# Patient Record
Sex: Female | Born: 1965 | Race: White | Hispanic: No | Marital: Married | Smoking: Never smoker
Health system: Southern US, Community
[De-identification: ages and names within clinical notes are randomized; demographics above are authoritative.]

## PROBLEM LIST (undated history)

## (undated) DIAGNOSIS — T7840XA Allergy, unspecified, initial encounter: Secondary | ICD-10-CM

## (undated) DIAGNOSIS — K5792 Diverticulitis of intestine, part unspecified, without perforation or abscess without bleeding: Secondary | ICD-10-CM

## (undated) DIAGNOSIS — Z5189 Encounter for other specified aftercare: Secondary | ICD-10-CM

## (undated) DIAGNOSIS — D649 Anemia, unspecified: Secondary | ICD-10-CM

## (undated) HISTORY — PX: HERNIA REPAIR: SHX51

## (undated) HISTORY — DX: Allergy, unspecified, initial encounter: T78.40XA

## (undated) HISTORY — DX: Anemia, unspecified: D64.9

## (undated) HISTORY — PX: APPENDECTOMY: SHX54

## (undated) HISTORY — DX: Encounter for other specified aftercare: Z51.89

---

## 1999-04-28 ENCOUNTER — Other Ambulatory Visit: Admission: RE | Admit: 1999-04-28 | Discharge: 1999-04-28 | Payer: Self-pay | Admitting: Obstetrics and Gynecology

## 2000-01-12 ENCOUNTER — Other Ambulatory Visit: Admission: RE | Admit: 2000-01-12 | Discharge: 2000-01-12 | Payer: Self-pay | Admitting: Obstetrics and Gynecology

## 2000-07-05 ENCOUNTER — Inpatient Hospital Stay (HOSPITAL_COMMUNITY): Admission: AD | Admit: 2000-07-05 | Discharge: 2000-07-05 | Payer: Self-pay | Admitting: Obstetrics and Gynecology

## 2000-07-07 ENCOUNTER — Inpatient Hospital Stay (HOSPITAL_COMMUNITY): Admission: AD | Admit: 2000-07-07 | Discharge: 2000-07-07 | Payer: Self-pay | Admitting: Obstetrics and Gynecology

## 2000-08-05 ENCOUNTER — Encounter (INDEPENDENT_AMBULATORY_CARE_PROVIDER_SITE_OTHER): Payer: Self-pay | Admitting: Specialist

## 2000-08-05 ENCOUNTER — Inpatient Hospital Stay (HOSPITAL_COMMUNITY): Admission: AD | Admit: 2000-08-05 | Discharge: 2000-08-08 | Payer: Self-pay | Admitting: Obstetrics & Gynecology

## 2000-09-16 ENCOUNTER — Other Ambulatory Visit: Admission: RE | Admit: 2000-09-16 | Discharge: 2000-09-16 | Payer: Self-pay | Admitting: Obstetrics and Gynecology

## 2001-01-02 ENCOUNTER — Encounter (INDEPENDENT_AMBULATORY_CARE_PROVIDER_SITE_OTHER): Payer: Self-pay | Admitting: Specialist

## 2001-01-02 ENCOUNTER — Other Ambulatory Visit: Admission: RE | Admit: 2001-01-02 | Discharge: 2001-01-02 | Payer: Self-pay | Admitting: *Deleted

## 2001-08-20 ENCOUNTER — Other Ambulatory Visit: Admission: RE | Admit: 2001-08-20 | Discharge: 2001-08-20 | Payer: Self-pay | Admitting: Obstetrics and Gynecology

## 2001-12-06 ENCOUNTER — Encounter: Payer: Self-pay | Admitting: Obstetrics & Gynecology

## 2001-12-06 ENCOUNTER — Inpatient Hospital Stay (HOSPITAL_COMMUNITY): Admission: AD | Admit: 2001-12-06 | Discharge: 2001-12-06 | Payer: Self-pay | Admitting: Obstetrics & Gynecology

## 2001-12-26 ENCOUNTER — Inpatient Hospital Stay (HOSPITAL_COMMUNITY): Admission: AD | Admit: 2001-12-26 | Discharge: 2001-12-26 | Payer: Self-pay | Admitting: Obstetrics & Gynecology

## 2001-12-27 ENCOUNTER — Inpatient Hospital Stay (HOSPITAL_COMMUNITY): Admission: AD | Admit: 2001-12-27 | Discharge: 2001-12-27 | Payer: Self-pay | Admitting: Obstetrics and Gynecology

## 2002-01-05 ENCOUNTER — Observation Stay (HOSPITAL_COMMUNITY): Admission: AD | Admit: 2002-01-05 | Discharge: 2002-01-06 | Payer: Self-pay | Admitting: Obstetrics and Gynecology

## 2002-01-31 ENCOUNTER — Encounter: Payer: Self-pay | Admitting: Obstetrics & Gynecology

## 2002-01-31 ENCOUNTER — Inpatient Hospital Stay (HOSPITAL_COMMUNITY): Admission: AD | Admit: 2002-01-31 | Discharge: 2002-01-31 | Payer: Self-pay | Admitting: Obstetrics & Gynecology

## 2002-02-02 ENCOUNTER — Inpatient Hospital Stay (HOSPITAL_COMMUNITY): Admission: AD | Admit: 2002-02-02 | Discharge: 2002-02-23 | Payer: Self-pay | Admitting: Obstetrics and Gynecology

## 2002-02-02 ENCOUNTER — Encounter (INDEPENDENT_AMBULATORY_CARE_PROVIDER_SITE_OTHER): Payer: Self-pay | Admitting: Specialist

## 2002-02-02 ENCOUNTER — Encounter: Payer: Self-pay | Admitting: Obstetrics and Gynecology

## 2002-02-06 ENCOUNTER — Encounter: Payer: Self-pay | Admitting: Obstetrics and Gynecology

## 2002-02-10 ENCOUNTER — Encounter: Payer: Self-pay | Admitting: Obstetrics and Gynecology

## 2002-02-13 ENCOUNTER — Encounter: Payer: Self-pay | Admitting: Obstetrics and Gynecology

## 2002-02-17 ENCOUNTER — Encounter: Payer: Self-pay | Admitting: Obstetrics and Gynecology

## 2002-02-20 ENCOUNTER — Encounter: Payer: Self-pay | Admitting: Obstetrics and Gynecology

## 2002-03-24 ENCOUNTER — Other Ambulatory Visit: Admission: RE | Admit: 2002-03-24 | Discharge: 2002-03-24 | Payer: Self-pay | Admitting: Obstetrics and Gynecology

## 2002-04-17 ENCOUNTER — Ambulatory Visit (HOSPITAL_COMMUNITY): Admission: RE | Admit: 2002-04-17 | Discharge: 2002-04-17 | Payer: Self-pay | Admitting: Obstetrics and Gynecology

## 2002-11-26 HISTORY — PX: TUBAL LIGATION: SHX77

## 2003-05-05 ENCOUNTER — Other Ambulatory Visit: Admission: RE | Admit: 2003-05-05 | Discharge: 2003-05-05 | Payer: Self-pay | Admitting: Obstetrics and Gynecology

## 2003-11-27 HISTORY — PX: ENDOMETRIAL ABLATION: SHX621

## 2003-12-12 ENCOUNTER — Emergency Department (HOSPITAL_COMMUNITY): Admission: AD | Admit: 2003-12-12 | Discharge: 2003-12-12 | Payer: Self-pay | Admitting: Internal Medicine

## 2004-02-08 ENCOUNTER — Ambulatory Visit (HOSPITAL_COMMUNITY): Admission: RE | Admit: 2004-02-08 | Discharge: 2004-02-08 | Payer: Self-pay | Admitting: *Deleted

## 2004-02-08 ENCOUNTER — Encounter (INDEPENDENT_AMBULATORY_CARE_PROVIDER_SITE_OTHER): Payer: Self-pay | Admitting: Specialist

## 2004-05-03 ENCOUNTER — Other Ambulatory Visit: Admission: RE | Admit: 2004-05-03 | Discharge: 2004-05-03 | Payer: Self-pay | Admitting: Obstetrics and Gynecology

## 2008-03-18 ENCOUNTER — Encounter: Admission: RE | Admit: 2008-03-18 | Discharge: 2008-03-18 | Payer: Self-pay | Admitting: Obstetrics and Gynecology

## 2009-04-21 ENCOUNTER — Encounter: Admission: RE | Admit: 2009-04-21 | Discharge: 2009-04-21 | Payer: Self-pay | Admitting: Obstetrics and Gynecology

## 2010-04-25 ENCOUNTER — Encounter: Admission: RE | Admit: 2010-04-25 | Discharge: 2010-04-25 | Payer: Self-pay | Admitting: Obstetrics and Gynecology

## 2010-04-28 ENCOUNTER — Encounter: Admission: RE | Admit: 2010-04-28 | Discharge: 2010-04-28 | Payer: Self-pay | Admitting: Obstetrics and Gynecology

## 2010-12-17 ENCOUNTER — Encounter: Payer: Self-pay | Admitting: Family Medicine

## 2011-04-13 NOTE — Op Note (Signed)
NAME:  Karen Watts, Karen Watts                       ACCOUNT NO.:  [id number]   MEDICAL RECORD NO.:  [id number]                   PATIENT TYPE:  AMB   LOCATION:  SDC                                  FACILITY:  [facility]   PHYSICIAN:  Lenoard Aden, M.D.             DATE OF BIRTH:  [birth year]   DATE OF PROCEDURE:  02/08/2004  DATE OF DISCHARGE:                                 OPERATIVE REPORT   PREOPERATIVE DIAGNOSIS:  Menometrorrhagia refractory to medical therapy and  normal workup.   POSTOPERATIVE DIAGNOSIS:  Menometrorrhagia refractory to medical therapy and  normal workup.   PROCEDURE:  NovaSure endometrial thermal ablation.   SURGEON:  Lenoard Aden, M.D.   ANESTHESIA:  General.   ESTIMATED BLOOD LOSS:  Less than 50 cc.   COMPLICATIONS:  None.   DRAINS:  None.   COUNTS:  Correct.   Patient recovering in good condition.   BRIEF OPERATIVE NOTE:  After being apprised of the previous risks of  inability to cure bleeding, possible uterine perforation and need for  repair, the patient was brought to the operating room, after where she has a  PPH performed by Dr. Luan Pulling and dictated separately at this time.  Patient is placed in the dorsal lithotomy position, still up in the  stirrups, exam under anesthesia reveals a small retroflexed uterus.  No  adnexal masses.  At this time, the anterior cervical area is grasped using a  single-tooth tenaculum and dilated up to a #27 with a Pratt dilator.  Cervical length of 3.5 cm noted.  Uterus completely sounds to 7 cm.  At this  time, the NovaSure unit is placed in a standard fashion, and ablation is  performed at a power of 77 for one minute and 50 seconds.  No complications  noted.  The procedure is terminated in the standard fashion.  Instruments  are removed.  Examination under anesthesia reveals a normal-sized uterus.  No adnexal masses.  No bleeding is noted.  Patient tolerated her procedure  well and was transferred to  recovery in good condition.                                               Lenoard Aden, M.D.    RJT/MEDQ  D:  02/08/2004  T:  02/08/2004  Job:  [id number]

## 2011-04-13 NOTE — Op Note (Signed)
NAME:  Karen Watts, Karen Watts                       ACCOUNT NO.:  [id number]   MEDICAL RECORD NO.:  [id number]                   PATIENT TYPE:  AMB   LOCATION:  DAY                                  FACILITY:  [facility]   PHYSICIAN:  Vikki Ports, M.D.         DATE OF BIRTH:  Sep 16, 1966   DATE OF PROCEDURE:  02/08/2004  DATE OF DISCHARGE:                                 OPERATIVE REPORT   PREOPERATIVE DIAGNOSIS:  Internal hemorrhoids.   POSTOPERATIVE DIAGNOSIS:  Internal hemorrhoids.   PROCEDURE:  Procedure for prolapsing hemorrhoids.   SURGEON:  Danna Hefty, M.D.   ANESTHESIA:  General.   DESCRIPTION OF PROCEDURE:  The patient was taken to the operating room,  placed in a supine position.  After adequate general anesthesia was induced,  she was placed in the prone jack-knife position.  Perianal and rectal prep  were undertaken.  Hemorrhoidal bundles were injected with 0.5 Marcaine and  Wydase.  Anal dilatation was performed up to 3 fingers.  Internal and  external sphincter muscles were injected with 30 mL of 0.5 Marcaine.  A 2-0  Prolene suture was placed in a pursestring fashion 5 cm proximal to the  dentate line.  PPH stapler was then introduced in through the anvil, and  pursestring suture was tied down around it.  Stapler was closed, held in  place for 30 seconds, fired, held in place for an additional 20 seconds and  removed.  There was a healthy 1 cm rim of hemorrhoidal tissue.  No evidence  of muscle.  Vagina was checked prior to the firing of the stapler.  The  staple line was inspected, had adequate hemostasis.  Gelfoam packing was  placed.  The patient tolerated the procedure well and was placed in the  supine position for endometrial ablation by Dr. Billy Coast.                                               Vikki Ports, M.D.    KRH/MEDQ  D:  02/08/2004  T:  02/08/2004  Job:  [id number]   cc:   Lenoard Aden, M.D.  [address]  [place]  [place]  [address]  Fax: [phone]

## 2011-04-13 NOTE — Discharge Summary (Signed)
[facility] of [facility]  Patient:    Karen Watts, Karen Watts Visit Number: [id number] MRN: [id number]          Service Type: OBS Attending Physician:  Maxie Better Dictated by:   Mont Dutton, M.D. Admit Date:  02/02/2002 Discharge Date: 02/23/2002                             Discharge Summary  ADMISSION DIAGNOSES:          1. Active labor with precipitous delivery.                               2. Retained placenta.  DISCHARGE DIAGNOSES:          1. Status post precipitous vaginal delivery of                                  viable female.                               2. Retained placenta status post manual                                  extraction in operating room without                                  complications.                               3. Bilateral tubal ligation.  HISTORY OF PRESENT ILLNESS:   The patient is a 45 year old, G4, P3-0-0-3, who presented at 41 weeks with contractions and spontaneous rupture of membranes. The patient was sleeping.  She was noted to have late prenatal care.  Onset of care was at [facility] at 38 weeks.  She had been on no medicatins.  She had normal spontaneous vaginal delivery x 3 previously.  No significant gynecologic, medical, or surgical history.  SOCIAL HISTORY:               She did smoke one-half pack per day, denied alcohol or other drug use.  HOSPITAL COURSE:              On cervical exam, she is 10 cm 100% effaced, and + 2 station.  The patient had spontaneous rupture of membranes complete and had precipitous delivery in the MAU.  Retained placenta was noted.  The patient went to OR for manual removal.  The patient delivered in MAU a viable female infant with Apgars 9/1 and 9/5. For her retained placenta, the patient underwent manual extraction in the OR without complications.  The patient also underwent a bilateral tubal ligation without difficulty on August 06, 2001.  The  patient was bottle feeding on urine drug screen secondary to patients lack of prenatal care.  She was noted to be positive for cannibis.  Newborn nursery was notified.  The patient did ahe a partial accreta noted.  On the day after admission, hospital day #2, the patient was doing well with slight abdominal pain, minimal lochia.  The baby was doing well  with bottle feeds, no complaints.  The patient was voiding well.   On exam, she had a firm fundus and umbilicus.  Routine postpartum care.  The babys urine drug screen was negative.  Social work was involved in patients hospitalization, noted that mom did have two previous postive urine drug screens during pregnancy. ______ report was made.  ______ notified for followup and CDC referral made.  The patient was stable for discharge on August 07, 2001.  DISPOSITION:                  She was discharged to home in wheelchair in stable condition.  DISCHARGE MEDICATIONS:        1. Motrin 600 mg 1 tablet q.6-8h. p.r.n.                               2. Prenatal vitamins 1 tablet p.o. q.d. until                                  postpartum checkup.  DISCHARGE INSTRUCTIONS:       Booklet was given for discharge teaching.  The patient is to bring her child to pediatric office August 11, 2001, at 1:30, to [facility].  DIET:                         Regular.  ACTIVITY:                     Per standard booklet.  Sexual activity per booklet.  FOLLOWUP:                     [facility] in six weeks. Dictated by:   Mont Dutton, M.D. Attending Physician:  Maxie Better DD:  03/20/02 TD:  03/21/02 Job: [id number]

## 2011-04-13 NOTE — H&P (Signed)
[facility] of [facility]  Patient:    Karen Watts, Karen Watts                      MRN: [id number] Adm. Date:  [id number] Attending:  Genia Del Dictator:   Genia Del, M.D.                         History and Physical  IDENTIFICATION:               The patient is a 45 year old G1, last menstrual period November 15, 1999, for an expected date of delivery August 23, 2000, at 37 weeks 4 days gestation.  REASON FOR ADMISSION:         Amniotic fluid leak at 4 p.m. on August 05, 2000.  HISTORY OF PRESENT ILLNESS:   Good fetal activity, no vaginal bleeding.  The patient felt clear fluid leak since 4 p.m. on September 10.  She had irregular mild uterine contractions.  No PIH symptoms.  PAST MEDICAL HISTORY:         IBS.  PAST SURGICAL HISTORY:        Appendicectomy.  MEDICATIONS:                  Prenatal vitamins.  ALLERGIES:                    No known drug allergies.  HISTORY OF PRESENT PREGNANCY:                    First trimester was normal.  Labs in first trimester showed hemoglobin 13.7, platelets 243.  Blood type Rh A positive, Rh antibody is negative.  RPR nonreactive.  HBsAg negative.  HIV nonreactive. Rubella immune.  Pap test within normal limits.  Gonorrhea and Chlamydia negative.  In the second trimester she had a triple test which was within normal limits.  A 18+ weeks she had a fall and an ultrasound was done showing a small subchorionic hematoma which decreased in size on followup ultrasound. At 21+ weeks gestation ultrasound revealed anatomy was within normal limits except for a low-lying placenta at 1.2 cm from the os.  Amniotic fluid was normal.  Cervix was 3.4 cm long.  At 28+ weeks placental insertion was shown to be normal with at least 4.9 cm distance from the internal os.  The cervix was 3.2 cm long and the estimated fetal weight was 99 percentile.  A one-hour GTT was abnormal, a three-hour GTT was done and it came back  normal.  She was then followed for preterm labor which was controlled by bed rest.  Group B strep at 35+ weeks was negative.  REVIEW OF SYSTEMS:            Constitutional: Negative.  HEENT: Negative. Respiratory: Negative.  Cardiovascular: Negative.  GI: Negative.  Urologic: Negative.  Dermatologic: Negative.  Neurologic: Negative.  PHYSICAL EXAMINATION:  GENERAL:                      On arrival the patient is in no apparent distress.  VITAL SIGNS:                  Blood pressure is 119/74, pulse 78, respiratory rate 20, temperature 97.2.  LUNGS:                        Clear bilaterally.  HEART:  Regular cardiac rhythm, no murmur.  ABDOMEN:                      Gravid with a uterine height at 37 cm, vertex presentation.  Fetal heart rate 130-140 per minute and no deceleration, good accelerations.  Uterine contractions are mild and irregular.  PELVIC:                       A speculum exam by Dr. Billy Coast at office: Crist Fat positive, Nitrazine positive, amniotic fluid clear.  Vaginal exam showed a 3+ dilated cervix, 80% effaced, vertex -1.  EXTREMITIES:                  All limbs were normal with mild edema and no clonus.  IMPRESSION:                   A gravida 1 at 37+ weeks gestation with the spontaneous rupture of membranes, not in active labor currently.  Fetal well-being reassuring.  Group B Streptococcus negative.  PLAN:                         Admit to labor and delivery, continuous monitoring.  Pitocin induction decided with the patient after an attempt at waiting and ambulating to see if labor would start spontaneously.  Will start ampicillin IV for prolonged rupture of membranes if not about to deliver at 4 a.m..  Expectant management for probable vaginal delivery. DD:  08/06/00 TD:  08/06/00 Job: [id number]

## 2011-04-13 NOTE — H&P (Signed)
[facility] of [facility]  Patient:    Karen Watts, FLEET Visit Number: [id number] MRN: [id number]          Service Type: OBS Attending Physician:  Maxie Better Dictated by:   Lenoard Aden, M.D. Admit Date:  02/02/2002 Discharge Date: 02/23/2002                           History and Physical  HISTORY OF PRESENT ILLNESS:   A 45 year old white female, G2, P2, who presents with desire for elective sterilization.  PAST OBSTETRIC HISTORY:       Remarkable for two vaginal deliveries, recent vaginal delivery complicated by placental abruption and early placenta previa. Her blood type is A positive.  ALLERGIES:                    She has no known drug allergies.  MEDICATIONS:                  Previously prenatal vitamins.  PAST MEDICAL HISTORY:         She has a history of irritable bowel syndrome.  PAST SURGICAL HISTORY:        Tonsillectomy in childhood and appendectomy in childhood.  SOCIAL HISTORY:               She is a nonsmoker, nondrinker.  Denies domestic or physical violence.  FAMILY HISTORY:               Noncontributory.  PHYSICAL EXAMINATION:  GENERAL:                      She is a well-developed, well-nourished white female in no apparent distress.  HEENT:                        Normal.  LUNGS:                        Clear.  HEART:                        Regular rate and rhythm.  ABDOMEN:                      Soft, nongravid, nontender.  PELVIC:                       Uterus is normal size, shape and contour.  No adnexal masses are appreciated.  IMPRESSION:                   Desire for elective sterilization.  PLAN:                         Proceed with laparoscopic tubal sterilization. The risks of anesthesia, infection, bleeding, injury to abdominal organs and the need for repair is discussed, delayed versus immediate complications to include bowel and bladder injury noted, failure risk of tubal ligation at 5-10 per 1000  noted.  The patient acknowledges and desires to proceed. Dictated by:   Lenoard Aden, M.D. Attending Physician:  Maxie Better DD:  04/17/02 TD:  04/17/02 Job: [id number]

## 2011-04-13 NOTE — Discharge Summary (Signed)
[facility] of [facility]  Patient:    Karen Watts, Karen Watts Visit Number: [id number] MRN: [id number]          Service Type: OBS Location: 910B 9152 01 Attending Physician:  Lenoard Aden Dictated by:   Lenoard Aden, M.D. Admit Date:  01/05/2002 Discharge Date: 01/06/2002   CC:         [facility]   Discharge Summary  REASON FOR ADMISSION:         Preterm labor.  DISCHARGE DIAGNOSIS:          Preterm labor.  HISTORY OF PRESENT ILLNESS:   The patient underwent hospitalization on January 05, 2002 for cervical change.  External ______ monitoring reveals irregular contractions on p.o. terbutaline and ibuprofen over a 24-hour period.  No cervical change noted.  She is discharged to home on day #1 on p.o. tocolytics to include terbutaline and ibuprofen.  She is to follow up in the office in one week.  Preterm labor warnings given.  Ambien prescription given. Dictated by:   Lenoard Aden, M.D. Attending Physician:  Lenoard Aden DD:  01/06/02 TD:  01/06/02 Job: [id number]

## 2011-04-13 NOTE — Discharge Summary (Signed)
[facility] of [facility]  Patient:    Karen Watts, Karen Watts Visit Number: [id number] MRN: [id number]          Service Type: OBS Attending Physician:  Maxie Better Dictated by:   Marina Gravel, M.D. Admit Date:  02/02/2002 Discharge Date: 02/23/2002                             Discharge Summary  ADMISSION DIAGNOSES:          1. Intrauterine pregnancy at 30-6/7 weeks.                               2. Third trimester vaginal bleeding.                               3. Chronic abruptio placenta.  DISCHARGE DIAGNOSES:          1. Intrauterine pregnancy at 30-6/7 weeks.                               2. Third trimester vaginal bleeding.                               3. Chronic abruptio placenta.  PROCEDURES:                   1. Admission for bed rest and continuous                                  monitoring.                               2. Amniocentesis for fetal immaturity.                               3. Induction of labor due to presence of fetal                                  immaturity with history of chronic abruption.  HISTORY OF PRESENT ILLNESS:   For complete details, please see the History & Physical on the chart.  Briefly, the patient presented as a 45 year old white female, gravida 2, para 1 at 30-6/7 weeks with third trimester vaginal bleeding.  She was hospitalized after her second episode of bleeding.  She has a history of preterm cervical change with known positive fetal fibronectin. She had been on bed rest and use of p.r.n. oral terbutaline.  The patient received betamethasone on January 31 and December 27, 2001.   Had been stable with cervical exam of 2 cm on bed rest at home.  During admission evaluation, she was found to be contracting every 2 to 4 minutes with vaginal bleeding.  The patient was admitted for further management of the above issues.  HOSPITAL COURSE:              On admission, the patient was tocolyzed with magnesium  sulfate, monitored continuously, and kept on bed rest.  Contractions subsequently abated, and the patient  did not demonstrate further cervical change.  Ultrasound showed no evidence of acute abruption and no evidence of placenta previa.  Magnesium sulfate was subsequently discontinued, and the patient was converted to terbutaline pump and was continued to be monitored and kept at bed rest in the hospital.  Additional monitoring was performed with twice biophysical profiles and umbilical artery Dopplers which showed reassuring results.  The patient remained stable until 34 weeks and was subsequently tested for fetal immaturity by amniocentesis.  The results showed LS ration 3.0 to 1.0 with PG present.  The patient was, therefore, induced given these results.  The patient was inducted with artificial rupture of membranes and Pitocin. She was found to have port wine stained amniotic fluid at amniotomy.  The patient was given ampicillin for unknown group B strep status.  She subsequently progressed and delivered by spontaneous vaginal delivery of a viable female infant, Apgars 8/9.  The NICU team was present at delivery. Nuchal cord x 1 was noted.  Placenta was spontaneously delivered and had changes that appeared consistent with old infarcts.  Arterial cord pH was obtained and was 7.01.  The patient postpartum had a rapid return to ability to ambulate, void, and tolerate a regular diet.  She was found to have a mild anemia with hemoglobin 7.4.   Therefore, I recommended against elective tubal sterilization at this time.  She was discharged home on the second postpartum day in satisfactory condition.  She was noted to be A positive, rubella immune, with hemoglobin as outlined above.  DISCHARGE MEDICATIONS:        1. Ferrous sulfate 325 mg p.o. daily.                               2. Colace 1 p.o. daily.  [facility] to six [facility]  CONDITION UPON  DISCHARGE:     Satisfactory. Dictated by:   Marina Gravel, M.D. Attending Physician:  Maxie Better DD:  03/23/02 TD:  03/23/02 Job: [id number]

## 2011-04-13 NOTE — H&P (Signed)
[facility] of [facility]  Patient:    Karen Watts, Karen Watts Visit Number: [id number] MRN: [id number]          Service Type: OBS Location: 910B 9152 01 Attending Physician:  Marcelle Overlie Dictated by:   Lenoard Aden, M.D. Admit Date:  01/05/2002                           History and Physical  CHIEF COMPLAINT:  Preterm labor.  HISTORY OF PRESENT ILLNESS:  A 45 year old Caucasian female G2, P69, EDD Apr 02, 2002, at 34-4/7 weeks who presents with progressive cervical change.  ALLERGIES:  PROCARDIA, questionable intolerance.  MEDICATIONS:  Prenatal vitamins and terbutaline.  PAST OBSTETRIC HISTORY: 1. Uncomplicated 8 lb 9 ounce female born at term in September 2001. 2. History of abnormal Pap smear. 3. History of premature birth. 4. History of irritable bowel syndrome. 5. History of appendectomy. 6. History of tonsillectomy.  FAMILY HISTORY:  Heart disease, stroke, and autism.  PRENATAL LABORATORY DATA:  Blood type A+, Rh antibody negative, rubella immune, HBsAg negative, HIV declined.  GBS negative on December 29, 2001. GC and chlamydia negative.  Fetal fibronectin positive.  The patient has a history of unexplained second trimester bleeding.  Most recent ultrasound on December 15, 2001, reveals  posterior low lying placenta with an estimated fetal weight of 1 lb 12 ounces in the 73rd percentile and a normal AFI.  PHYSICAL EXAMINATION:  GENERAL:  She is a well-developed well-nourished white female.  Blood pressure 110/78, weight 160.6.  HEENT:  Normal.  LUNGS:  Clear.  HEART:  Tachycardic with no murmurs, rubs or gallops.  ABDOMEN:  Soft, gravid, nontender.  Fundal height of 31.  No CVA tenderness.  EXTREMITIES:  REveals no cords.  NEUROLOGIC: Nonfocal.  CERVIX:  2 cm dilated, 2 to 2-1/2 cm long, soft, vertex and minus 2.  IMPRESSION:  A 27-4/7 week intrauterine pregnancy.  Progressive preterm cervicaL change on p.o. tocolytics.  PLAN:   Admission.  Possible IV tocolysis.  Continuous monitoring.  Bed rest with bathroom privileges only. Dictated by:   Lenoard Aden, M.D. Attending Physician:  Marcelle Overlie DD:  01/05/02 TD:  01/05/02 Job: [id number]

## 2011-04-13 NOTE — Op Note (Signed)
[facility]  Patient:    Karen Watts, Karen Watts Visit Number: [id number] MRN: [id number]          Service Type: DSU Location: [facility] Attending Physician:  Lenoard Aden Dictated by:   Lenoard Aden, M.D. Proc. Date: 04/17/02 Admit Date:  04/17/2002 Discharge Date: 04/17/2002   CC:         [facility]   Operative Report  PREOPERATIVE DIAGNOSIS:       Desire for elective sterilization.  POSTOPERATIVE DIAGNOSIS:      Desire for elective sterilization.  OPERATION:                    Laparoscopic tubal sterilization.  SURGEON:                      Lenoard Aden, M.D.  ANESTHESIA:                   General.  ESTIMATED BLOOD LOSS:         50 cc.  COMPLICATIONS:                None.  DRAINS:                       None.  COUNTS:                       Correct.  DISPOSITION:                  The patient to recovery in good condition.  DESCRIPTION OF PROCEDURE:     After being apprised of the risks of anesthesia, infection, bleeding, injury to abdominal organs and need for repair, the patient was brought to the operating room.  She was administered a general anesthetic without complications, prepped and draped in the usual sterile fashion.  Catheterized until the bladder was empty.  After achieving adequate anesthesia, exam under anesthesia was a small anteflexed uterus, and no adnexal masses.  A Hulka tenaculum was placed per vagina.  Attention was then turned to the abdominal portion of the procedure, whereby an infraumbilical incision was made with the scalpel after dilute Marcaine solution, 5 cc was placed.  A Veress needle placed.  Opening pressure -1 noted.  Five liters of CO2 insufflated without difficulty.  Setting patient pressure is 25.  Atraumatic placement of 10 mm trocar is done atraumatic. Trocar visualization is visualized.  A picture is taken.  Patient pressure reset to 15.  Liver and gallbladder bed appeared within  normal limits.  The appendix is surgically removed.  Small adhesions of the bowel mesentery to the right lateral abdominal wall are noted.  Normal tubes, ovaries, and uterus are observed.  The right tube is traced out to the fimbriated end and grasped using Klepinger bipolar cautery and cauterized to a resistance of zero in three contiguous portions of the ampullary isthmic section of the tube. At this time, the same procedure was done to the left tube.  Both tubes are divided using straight scissors and tubal lumens are visualized.  Normal ovaries are noted.  Normal anterior and posterior cul-de-sac.  CO2 is released and the instruments removed under direct visualization.  The incision was closed using 0 Vicryl in an interrupted fashion.  Dermabond placed on the skin.  Good hemostasis noted.  Instruments were removed from the vagina. The patient was awakened and transferred to recovery in good condition. Dictated  by:   Lenoard Aden, M.D. Attending Physician:  Lenoard Aden DD:  04/17/02 TD:  04/21/02 Job: [id number]

## 2011-04-13 NOTE — H&P (Signed)
NAME:  Karen Watts, Karen Watts                       ACCOUNT NO.:  [id number]   MEDICAL RECORD NO.:  [id number]                   PATIENT TYPE:  AMB   LOCATION:  [facility]                                  FACILITY:  [facility]   PHYSICIAN:  Lenoard Aden, M.D.             DATE OF BIRTH:  1966/10/04   DATE OF ADMISSION:  02/08/2004  DATE OF DISCHARGE:                                HISTORY & PHYSICAL   CHIEF COMPLAINT:  Worsening menometrorrhagia with normal work-up.   HISTORY OF PRESENT ILLNESS:  The patient is a 45 year old white female, G2,  P2, who presents for definitive management of irregular and heavy uterine  bleeding.   PAST MEDICAL HISTORY:  1. Remarkable for laparoscopic tubal ligation and two vaginal deliveries.  2. She has no known drug allergies.  3. Medications:  None.  4. She has a history of irritable bowel syndrome.  5. __________ colectomy.  6. Appendectomy.   SOCIAL HISTORY:  Nonsmoker, nondrinker.  Denies domestic or physical  violence.   FAMILY HISTORY:  Noncontributory.   PHYSICAL EXAMINATION:  GENERAL:  Well-developed, well-nourished, white  female in no acute distress.  HEENT:  Normal.  LUNGS:  Clear.  HEART:  Regular rate and rhythm.  ABDOMEN:  Soft, nontender.  PELVIC:  Anteflexed uterus and no adnexal masses.   IMPRESSION:  1. Persistent menometrorrhagia.  2. Status post tubal ligation.  3. For definitive therapy.  Patient declined hysterectomy and plans to     proceed with endometrial ablation.   Risks of anesthesia, infection, bleeding, uterine perforation, and possible  need for repair were discussed.  Inability to cure bleeding noted.  The  patient acknowledges, wishes to proceed.                                               Lenoard Aden, M.D.    RJT/MEDQ  D:  02/07/2004  T:  02/07/2004  Job:  [id number]

## 2011-05-15 ENCOUNTER — Other Ambulatory Visit: Payer: Self-pay | Admitting: Obstetrics and Gynecology

## 2011-05-15 DIAGNOSIS — Z1231 Encounter for screening mammogram for malignant neoplasm of breast: Secondary | ICD-10-CM

## 2011-05-25 ENCOUNTER — Ambulatory Visit
Admission: RE | Admit: 2011-05-25 | Discharge: 2011-05-25 | Disposition: A | Discharge status: Home / Self Care | Payer: BC Managed Care – PPO | Source: Ambulatory Visit | Attending: Obstetrics and Gynecology | Admitting: Obstetrics and Gynecology

## 2011-05-25 DIAGNOSIS — Z1231 Encounter for screening mammogram for malignant neoplasm of breast: Secondary | ICD-10-CM

## 2012-11-24 ENCOUNTER — Other Ambulatory Visit: Payer: Self-pay | Admitting: Obstetrics and Gynecology

## 2012-11-24 DIAGNOSIS — Z1231 Encounter for screening mammogram for malignant neoplasm of breast: Secondary | ICD-10-CM

## 2012-12-18 ENCOUNTER — Ambulatory Visit: Payer: BC Managed Care – PPO

## 2013-02-23 ENCOUNTER — Ambulatory Visit: Payer: BC Managed Care – PPO

## 2013-02-23 ENCOUNTER — Ambulatory Visit (INDEPENDENT_AMBULATORY_CARE_PROVIDER_SITE_OTHER): Payer: BC Managed Care – PPO | Admitting: Family Medicine

## 2013-02-23 VITALS — BP 108/69 | HR 63 | Temp 98.5°F | Resp 16 | Ht 64.5 in | Wt 153.0 lb

## 2013-02-23 DIAGNOSIS — M25521 Pain in right elbow: Secondary | ICD-10-CM

## 2013-02-23 DIAGNOSIS — M7710 Lateral epicondylitis, unspecified elbow: Secondary | ICD-10-CM

## 2013-02-23 DIAGNOSIS — M25529 Pain in unspecified elbow: Secondary | ICD-10-CM

## 2013-02-23 DIAGNOSIS — M7712 Lateral epicondylitis, left elbow: Secondary | ICD-10-CM

## 2013-02-23 NOTE — Patient Instructions (Addendum)
Lateral Epicondylitis (Tennis Elbow) with Rehab Lateral epicondylitis involves inflammation and pain around the outer portion of the elbow. The pain is caused by inflammation of the tendons in the forearm that bring back (extend) the wrist. Lateral epicondylittis is also called tennis elbow, because it is very common in tennis players. However, it may occur in any individual who extends the wrist repetitively. If lateral epicondylitis is left untreated, it may become a chronic problem. SYMPTOMS   Pain, tenderness, and inflammation on the outer (lateral) side of the elbow.  Pain or weakness with gripping activities.  Pain that increases with wrist twisting motions (playing tennis, using a screwdriver, opening a door or a jar).  Pain with lifting objects, including a coffee cup. CAUSES  Lateral epicondylitis is caused by inflammation of the tendons that extend the wrist. Causes of injury may include:  Repetitive stress and strain on the muscles and tendons that extend the wrist.  Sudden change in activity level or intensity.  Incorrect grip in racquet sports.  Incorrect grip size of racquet (often too large).  Incorrect hitting position or technique (usually backhand, leading with the elbow).  Using a racket that is too heavy. RISK INCREASES WITH:  Sports or occupations that require repetitive and/or strenuous forearm and wrist movements (tennis, squash, racquetball, carpentry).  Poor wrist and forearm strength and flexibility.  Failure to warm up properly before activity.  Resuming activity before healing, rehabilitation, and conditioning are complete. PREVENTION   Warm up and stretch properly before activity.  Maintain physical fitness:  Strength, flexibility, and endurance.  Cardiovascular fitness.  Wear and use properly fitted equipment.  Learn and use proper technique and have a coach correct improper technique.  Wear a tennis elbow (counterforce) brace. PROGNOSIS   The course of this condition depends on the degree of the injury. If treated properly, acute cases (symptoms lasting less than 4 weeks) are often resolved in 2 to 6 weeks. Chronic (longer lasting cases) often resolve in 3 to 6 months, but may require physical therapy. RELATED COMPLICATIONS   Frequently recurring symptoms, resulting in a chronic problem. Properly treating the problem the first time decreases frequency of recurrence.  Chronic inflammation, scarring tendon degeneration, and partial tendon tear, requiring surgery.  Delayed healing or resolution of symptoms. TREATMENT  Treatment first involves the use of ice and medicine, to reduce pain and inflammation. Strengthening and stretching exercises may help reduce discomfort, if performed regularly. These exercises may be performed at home, if the condition is an acute injury. Chronic cases may require a referral to a physical therapist for evaluation and treatment. Your caregiver may advise a corticosteroid injection, to help reduce inflammation. Rarely, surgery is needed. MEDICATION  If pain medicine is needed, nonsteroidal anti-inflammatory medicines (aspirin and ibuprofen), or other minor pain relievers (acetaminophen), are often advised.  Do not take pain medicine for 7 days before surgery.  Prescription pain relievers may be given, if your caregiver thinks they are needed. Use only as directed and only as much as you need.  Corticosteroid injections may be recommended. These injections should be reserved only for the most severe cases, because they can only be given a certain number of times. HEAT AND COLD  Cold treatment (icing) should be applied for 10 to 15 minutes every 2 to 3 hours for inflammation and pain, and immediately after activity that aggravates your symptoms. Use ice packs or an ice massage.  Heat treatment may be used before performing stretching and strengthening activities prescribed by your   caregiver, physical  therapist, or athletic trainer. Use a heat pack or a warm water soak. SEEK MEDICAL CARE IF: Symptoms get worse or do not improve in 2 weeks, despite treatment. EXERCISES  RANGE OF MOTION (ROM) AND STRETCHING EXERCISES - Epicondylitis, Lateral (Tennis Elbow) These exercises may help you when beginning to rehabilitate your injury. Your symptoms may go away with or without further involvement from your physician, physical therapist or athletic trainer. While completing these exercises, remember:   Restoring tissue flexibility helps normal motion to return to the joints. This allows healthier, less painful movement and activity.  An effective stretch should be held for at least 30 seconds.  A stretch should never be painful. You should only feel a gentle lengthening or release in the stretched tissue. RANGE OF MOTION  Wrist Flexion, Active-Assisted  Extend your right / left elbow with your fingers pointing down.*  Gently pull the back of your hand towards you, until you feel a gentle stretch on the top of your forearm.  Hold this position for __________ seconds. Repeat __________ times. Complete this exercise __________ times per day.  *If directed by your physician, physical therapist or athletic trainer, complete this stretch with your elbow bent, rather than extended. RANGE OF MOTION  Wrist Extension, Active-Assisted  Extend your right / left elbow and turn your palm upwards.*  Gently pull your palm and fingertips back, so your wrist extends and your fingers point more toward the ground.  You should feel a gentle stretch on the inside of your forearm.  Hold this position for __________ seconds. Repeat __________ times. Complete this exercise __________ times per day. *If directed by your physician, physical therapist or athletic trainer, complete this stretch with your elbow bent, rather than extended. STRETCH - Wrist Flexion  Place the back of your right / left hand on a tabletop,  leaving your elbow slightly bent. Your fingers should point away from your body.  Gently press the back of your hand down onto the table by straightening your elbow. You should feel a stretch on the top of your forearm.  Hold this position for __________ seconds. Repeat __________ times. Complete this stretch __________ times per day.  STRETCH  Wrist Extension   Place your right / left fingertips on a tabletop, leaving your elbow slightly bent. Your fingers should point backwards.  Gently press your fingers and palm down onto the table by straightening your elbow. You should feel a stretch on the inside of your forearm.  Hold this position for __________ seconds. Repeat __________ times. Complete this stretch __________ times per day.  STRENGTHENING EXERCISES - Epicondylitis, Lateral (Tennis Elbow) These exercises may help you when beginning to rehabilitate your injury. They may resolve your symptoms with or without further involvement from your physician, physical therapist or athletic trainer. While completing these exercises, remember:   Muscles can gain both the endurance and the strength needed for everyday activities through controlled exercises.  Complete these exercises as instructed by your physician, physical therapist or athletic trainer. Increase the resistance and repetitions only as guided.  You may experience muscle soreness or fatigue, but the pain or discomfort you are trying to eliminate should never worsen during these exercises. If this pain does get worse, stop and make sure you are following the directions exactly. If the pain is still present after adjustments, discontinue the exercise until you can discuss the trouble with your caregiver. STRENGTH Wrist Flexors  Sit with your right / left forearm palm-up and   fully supported on a table or countertop. Your elbow should be resting below the height of your shoulder. Allow your wrist to extend over the edge of the  surface.  Loosely holding a __________ weight, or a piece of rubber exercise band or tubing, slowly curl your hand up toward your forearm.  Hold this position for __________ seconds. Slowly lower the wrist back to the starting position in a controlled manner. Repeat __________ times. Complete this exercise __________ times per day.  STRENGTH  Wrist Extensors  Sit with your right / left forearm palm-down and fully supported on a table or countertop. Your elbow should be resting below the height of your shoulder. Allow your wrist to extend over the edge of the surface.  Loosely holding a __________ weight, or a piece of rubber exercise band or tubing, slowly curl your hand up toward your forearm.  Hold this position for __________ seconds. Slowly lower the wrist back to the starting position in a controlled manner. Repeat __________ times. Complete this exercise __________ times per day.  STRENGTH - Ulnar Deviators  Stand with a ____________________ weight in your right / left hand, or sit while holding a rubber exercise band or tubing, with your healthy arm supported on a table or countertop.  Move your wrist, so that your pinkie travels toward your forearm and your thumb moves away from your forearm.  Hold this position for __________ seconds and then slowly lower the wrist back to the starting position. Repeat __________ times. Complete this exercise __________ times per day STRENGTH - Radial Deviators  Stand with a ____________________ weight in your right / left hand, or sit while holding a rubber exercise band or tubing, with your injured arm supported on a table or countertop.  Raise your hand upward in front of you or pull up on the rubber tubing.  Hold this position for __________ seconds and then slowly lower the wrist back to the starting position. Repeat __________ times. Complete this exercise __________ times per day. STRENGTH  Forearm Supinators   Sit with your right /  left forearm supported on a table, keeping your elbow below shoulder height. Rest your hand over the edge, palm down.  Gently grip a hammer or a soup ladle.  Without moving your elbow, slowly turn your palm and hand upward to a "thumbs-up" position.  Hold this position for __________ seconds. Slowly return to the starting position. Repeat __________ times. Complete this exercise __________ times per day.  STRENGTH  Forearm Pronators   Sit with your right / left forearm supported on a table, keeping your elbow below shoulder height. Rest your hand over the edge, palm up.  Gently grip a hammer or a soup ladle.  Without moving your elbow, slowly turn your palm and hand upward to a "thumbs-up" position.  Hold this position for __________ seconds. Slowly return to the starting position. Repeat __________ times. Complete this exercise __________ times per day.  STRENGTH - Grip  Grasp a tennis ball, a dense sponge, or a large, rolled sock in your hand.  Squeeze as hard as you can, without increasing any pain.  Hold this position for __________ seconds. Release your grip slowly. Repeat __________ times. Complete this exercise __________ times per day.  STRENGTH - Elbow Extensors, Isometric  Stand or sit upright, on a firm surface. Place your right / left arm so that your palm faces your stomach, and it is at the height of your waist.  Place your opposite hand on   the underside of your forearm. Gently push up as your right / left arm resists. Push as hard as you can with both arms, without causing any pain or movement at your right / left elbow. Hold this stationary position for __________ seconds. Gradually release the tension in both arms. Allow your muscles to relax completely before repeating. Document Released: 11/12/2005 Document Revised: 02/04/2012 Document Reviewed: 02/24/2009 [employer] Patient Information 2013 [employer].  

## 2013-02-23 NOTE — Progress Notes (Signed)
  Subjective:    Patient ID: Karen Watts, female    DOB: 06/13/1966, 47 y.o.   MRN: [id number] Chief Complaint  Patient presents with  . Elbow Injury    right elbow pain x103mth    HPI  Has been having worsening right lateral elbow pain for the past mo. Pt did bump it a couple of times - once or twice really hard, and since then she has been having worsening pain over her lateral elbow and the bony point.  Had been able to largely control it with otc nsaids but this wk they haven't touched it, has just become unbearable. Even very painful to drink a cup of coffee or pick up the milk jug.  Hurts to grasp things.  Past Medical History  Diagnosis Date  . Allergy   . Anemia   . Blood transfusion without reported diagnosis    No current outpatient prescriptions on file prior to visit.   No current facility-administered medications on file prior to visit.   No Known Allergies   Review of Systems  Constitutional: Positive for activity change. Negative for fever, chills and unexpected weight change.  Musculoskeletal: Positive for arthralgias. Negative for myalgias, back pain, joint swelling and gait problem.  Skin: Negative for color change and rash.  Neurological: Negative for weakness and numbness.      BP 108/69  Pulse 63  Temp(Src) 98.5 F (36.9 C) (Oral)  Resp 16  Ht 5' 4.5" (1.638 m)  Wt 153 lb (69.4 kg)  BMI 25.87 kg/m2  SpO2 99% Objective:   Physical Exam  Constitutional: She is oriented to person, place, and time. She appears well-developed and well-nourished. No distress.  HENT:  Head: Normocephalic and atraumatic.  Right Ear: External ear normal.  Eyes: Conjunctivae are normal. No scleral icterus.  Pulmonary/Chest: Effort normal.  Musculoskeletal:       Right elbow: She exhibits normal range of motion, no swelling, no effusion, no deformity and no laceration. Tenderness found. Lateral epicondyle tenderness noted. No radial head, no medial epicondyle and no  olecranon process tenderness noted.  Pain worsens with resisted wrist extension  Neurological: She is alert and oriented to person, place, and time.  Skin: Skin is warm and dry. She is not diaphoretic. No erythema.  Psychiatric: She has a normal mood and affect. Her behavior is normal.     [facility] reading (PRIMARY) by  Dr. Clelia Croft. Rt elbow xray: normal  Assessment & Plan:  Elbow pain, right - Plan: DG Elbow Complete Right, CANCELED: DG Elbow Complete Right  Lateral epicondylitis (tennis elbow), left - wear epicondyle band to right arm at all times until sxs completely resolved and back to normal. Then start strengthening with recommended exercises.  If pain cont, refer to ortho.  Meds ordered this encounter  Medications  . naproxen sodium (ANAPROX) 220 MG tablet    Sig: Take 220 mg by mouth 2 (two) times daily with a meal.  . acetaminophen (TYLENOL) 500 MG tablet    Sig: Take 500 mg by mouth every 6 (six) hours as needed for pain.  . diphenhydrAMINE (BENADRYL) 25 MG tablet    Sig: Take 25 mg by mouth every 6 (six) hours as needed for itching.

## 2013-03-18 ENCOUNTER — Ambulatory Visit
Admission: RE | Admit: 2013-03-18 | Discharge: 2013-03-18 | Disposition: A | Discharge status: Home / Self Care | Payer: BC Managed Care – PPO | Source: Ambulatory Visit | Attending: Obstetrics and Gynecology | Admitting: Obstetrics and Gynecology

## 2013-03-18 DIAGNOSIS — Z1231 Encounter for screening mammogram for malignant neoplasm of breast: Secondary | ICD-10-CM

## 2013-05-14 ENCOUNTER — Other Ambulatory Visit: Payer: Self-pay | Admitting: Obstetrics and Gynecology

## 2013-05-14 DIAGNOSIS — N6321 Unspecified lump in the left breast, upper outer quadrant: Secondary | ICD-10-CM

## 2013-05-26 ENCOUNTER — Ambulatory Visit
Admission: RE | Admit: 2013-05-26 | Discharge: 2013-05-26 | Disposition: A | Discharge status: Home / Self Care | Payer: BC Managed Care – PPO | Source: Ambulatory Visit | Attending: Obstetrics and Gynecology | Admitting: Obstetrics and Gynecology

## 2013-05-26 DIAGNOSIS — N6321 Unspecified lump in the left breast, upper outer quadrant: Secondary | ICD-10-CM

## 2014-03-31 ENCOUNTER — Ambulatory Visit (INDEPENDENT_AMBULATORY_CARE_PROVIDER_SITE_OTHER): Payer: BC Managed Care – PPO | Admitting: Family Medicine

## 2014-03-31 VITALS — BP 122/74 | HR 61 | Temp 98.2°F | Resp 17 | Ht 64.5 in | Wt 159.0 lb

## 2014-03-31 DIAGNOSIS — N390 Urinary tract infection, site not specified: Secondary | ICD-10-CM

## 2014-03-31 DIAGNOSIS — R3589 Other polyuria: Secondary | ICD-10-CM

## 2014-03-31 DIAGNOSIS — R358 Other polyuria: Secondary | ICD-10-CM

## 2014-03-31 DIAGNOSIS — R631 Polydipsia: Secondary | ICD-10-CM

## 2014-03-31 DIAGNOSIS — R300 Dysuria: Secondary | ICD-10-CM

## 2014-03-31 LAB — POCT UA - MICROSCOPIC ONLY
Casts, Ur, LPF, POC: NEGATIVE
Crystals, Ur, HPF, POC: NEGATIVE
Mucus, UA: POSITIVE
Yeast, UA: NEGATIVE

## 2014-03-31 LAB — POCT URINALYSIS DIPSTICK
Blood, UA: NEGATIVE
Leukocytes, UA: NEGATIVE
Nitrite, UA: POSITIVE
Spec Grav, UA: 1.02
Urobilinogen, UA: 1
pH, UA: 6.5

## 2014-03-31 LAB — POCT GLYCOSYLATED HEMOGLOBIN (HGB A1C): Hemoglobin A1C: 4.8

## 2014-03-31 MED ORDER — CIPROFLOXACIN HCL 500 MG PO TABS: 500.0000 mg | ORAL_TABLET | Freq: Two times a day (BID) | ORAL | Status: DC

## 2014-03-31 NOTE — Progress Notes (Signed)
Chief Complaint:  Chief Complaint  Patient presents with  . Dysuria    HPI: Karen Watts is a 48 y.o. female who is here for  2 weeks ago started having upper back pain, on right side and has been moving. She has had some pelvic pain, on right side, but already has had appendectomy.  She has taken amox that she had leftover and that has not helped. No fevers, chills, n/v/abd pain, vaginal dc  Past Medical History  Diagnosis Date  . Allergy   . Anemia   . Blood transfusion without reported diagnosis    Past Surgical History  Procedure Laterality Date  . Appendectomy    . Hernia repair    . Endometrial ablation Bilateral 2005   History   Social History  . Marital Status: Married    Spouse Name: N/A    Number of Children: N/A  . Years of Education: N/A   Social History Main Topics  . Smoking status: Never Smoker   . Smokeless tobacco: None  . Alcohol Use: No  . Drug Use: No  . Sexual Activity: Yes   Other Topics Concern  . None   Social History Narrative  . None   No family history on file. No Known Allergies Prior to Admission medications   Medication Sig Start Date End Date Taking? Authorizing Provider  acetaminophen (TYLENOL) 500 MG tablet Take 500 mg by mouth every 6 (six) hours as needed for pain.   Yes Historical Provider, MD  amoxicillin (AMOXIL) 500 MG tablet Take 500 mg by mouth 2 (two) times daily.   Yes Historical Provider, MD  diphenhydrAMINE (BENADRYL) 25 MG tablet Take 25 mg by mouth every 6 (six) hours as needed for itching.   Yes Historical Provider, MD  naproxen sodium (ANAPROX) 220 MG tablet Take 220 mg by mouth 2 (two) times daily with a meal.   Yes Historical Provider, MD     ROS: The patient denies fevers, chills, night sweats, unintentional weight loss, chest pain, palpitations, wheezing, dyspnea on exertion, nausea, vomiting,  hematuria, melena, numbness, weakness, or tingling.   All other systems have been reviewed and were  otherwise negative with the exception of those mentioned in the HPI and as above.    PHYSICAL EXAM: Filed Vitals:   03/31/14 0918  BP: 122/74  Pulse: 61  Temp: 98.2 F (36.8 C)  Resp: 17   Filed Vitals:   03/31/14 0918  Height: 5' 4.5" (1.638 m)  Weight: 159 lb (72.122 kg)   Body mass index is 26.88 kg/(m^2).  General: Alert, no acute distress HEENT:  Normocephalic, atraumatic, oropharynx patent. EOMI, PERRLA Cardiovascular:  Regular rate and rhythm, no rubs murmurs or gallops.  No Carotid bruits, radial pulse intact. No pedal edema.  Respiratory: Clear to auscultation bilaterally.  No wheezes, rales, or rhonchi.  No cyanosis, no use of accessory musculature GI: No organomegaly, abdomen is soft and non-tender, positive bowel sounds.  No masses. Skin: No rashes. Neurologic: Facial musculature symmetric. Psychiatric: Patient is appropriate throughout our interaction. Lymphatic: No cervical lymphadenopathy Musculoskeletal: Gait intact. + CVA tenderness   LABS: Results for orders placed in visit on 03/31/14  POCT UA - MICROSCOPIC ONLY      Result Value Ref Range   WBC, Ur, HPF, POC 2-6     RBC, urine, microscopic 0-4     Bacteria, U Microscopic 3+     Mucus, UA pos     Epithelial cells, urine per micros  14-25     Crystals, Ur, HPF, POC neg     Casts, Ur, LPF, POC neg     Yeast, UA neg    POCT URINALYSIS DIPSTICK      Result Value Ref Range   Color, UA orange     Clarity, UA slightly cloudy     Glucose, UA 100ml     Bilirubin, UA small     Ketones, UA trace     Spec Grav, UA 1.020     Blood, UA neg     pH, UA 6.5     Protein, UA 30ml     Urobilinogen, UA 1.0     Nitrite, UA pos     Leukocytes, UA Negative    POCT GLYCOSYLATED HEMOGLOBIN (HGB A1C)      Result Value Ref Range   Hemoglobin A1C 4.8       EKG/XRAY:   [facility]   ASSESSMENT/PLAN: Encounter Diagnoses  Name Primary?  . Dysuria Yes  . UTI (urinary tract  infection)   . Polydipsia   . Polyuria    Fu in 2 weeks for repeat urine , labs only visit , future orders in place Rx Cipro 500 mg BID x 10 days, today's urine cx pending F/u prn  Gross sideeffects, risk and benefits, and alternatives of medications d/w patient. Patient is aware that all medications have potential sideeffects and we are unable to predict every sideeffect or drug-drug interaction that may occur.  Lenell Antuhao P Le, DO 03/31/2014 10:22 AM

## 2014-03-31 NOTE — Patient Instructions (Signed)
Urinary Tract Infection  Urinary tract infections (UTIs) can develop anywhere along your urinary tract. Your urinary tract is your body's drainage system for removing wastes and extra water. Your urinary tract includes two kidneys, two ureters, a bladder, and a urethra. Your kidneys are a pair of bean-shaped organs. Each kidney is about the size of your fist. They are located below your ribs, one on each side of your spine.  CAUSES  Infections are caused by microbes, which are microscopic organisms, including fungi, viruses, and bacteria. These organisms are so small that they can only be seen through a microscope. Bacteria are the microbes that most commonly cause UTIs.  SYMPTOMS   Symptoms of UTIs may vary by age and gender of the patient and by the location of the infection. Symptoms in young women typically include a frequent and intense urge to urinate and a painful, burning feeling in the bladder or urethra during urination. Older women and men are more likely to be tired, shaky, and weak and have muscle aches and abdominal pain. A fever may mean the infection is in your kidneys. Other symptoms of a kidney infection include pain in your back or sides below the ribs, nausea, and vomiting.  DIAGNOSIS  To diagnose a UTI, your caregiver will ask you about your symptoms. Your caregiver also will ask to provide a urine sample. The urine sample will be tested for bacteria and white blood cells. White blood cells are made by your body to help fight infection.  TREATMENT   Typically, UTIs can be treated with medication. Because most UTIs are caused by a bacterial infection, they usually can be treated with the use of antibiotics. The choice of antibiotic and length of treatment depend on your symptoms and the type of bacteria causing your infection.  HOME CARE INSTRUCTIONS   If you were prescribed antibiotics, take them exactly as your caregiver instructs you. Finish the medication even if you feel better after you  have only taken some of the medication.   Drink enough water and fluids to keep your urine clear or pale yellow.   Avoid caffeine, tea, and carbonated beverages. They tend to irritate your bladder.   Empty your bladder often. Avoid holding urine for long periods of time.   Empty your bladder before and after sexual intercourse.   After a bowel movement, women should cleanse from front to back. Use each tissue only once.  SEEK MEDICAL CARE IF:    You have back pain.   You develop a fever.   Your symptoms do not begin to resolve within 3 days.  SEEK IMMEDIATE MEDICAL CARE IF:    You have severe back pain or lower abdominal pain.   You develop chills.   You have nausea or vomiting.   You have continued burning or discomfort with urination.  MAKE SURE YOU:    Understand these instructions.   Will watch your condition.   Will get help right away if you are not doing well or get worse.  Document Released: 08/22/2005 Document Revised: 05/13/2012 Document Reviewed: 12/21/2011  [employer] Patient Information 2014 [employer].

## 2014-04-01 LAB — URINE CULTURE
Colony Count: NO GROWTH
Organism ID, Bacteria: NO GROWTH

## 2014-04-06 ENCOUNTER — Telehealth: Payer: Self-pay

## 2014-04-06 ENCOUNTER — Other Ambulatory Visit: Payer: Self-pay | Admitting: Family Medicine

## 2014-04-06 DIAGNOSIS — R300 Dysuria: Secondary | ICD-10-CM

## 2014-04-06 MED ORDER — PHENAZOPYRIDINE HCL 200 MG PO TABS: 200.0000 mg | ORAL_TABLET | Freq: Three times a day (TID) | ORAL | Status: DC | PRN

## 2014-04-06 NOTE — Telephone Encounter (Signed)
Spoke with patient. Rx pyrdium if no improvement then return to office.

## 2014-04-06 NOTE — Telephone Encounter (Signed)
Patient she was seen last Wednesday and Dr. Conley RollsLe prescribed cipro.  She is still having urinary frequency and back pain.  Sometimes there is burning.  Should she be on a different antibiotic?  Please advise.

## 2014-04-06 NOTE — Telephone Encounter (Signed)
Patient called stated antibiotic prescribed is not working. Patient stated she was seen by Dr. Conley RollsLe last Wednesday. [phone]

## 2014-04-15 ENCOUNTER — Ambulatory Visit (INDEPENDENT_AMBULATORY_CARE_PROVIDER_SITE_OTHER): Payer: BC Managed Care – PPO | Admitting: Family Medicine

## 2014-04-15 ENCOUNTER — Ambulatory Visit: Payer: BC Managed Care – PPO

## 2014-04-15 VITALS — BP 120/60 | HR 57 | Temp 97.7°F | Resp 16 | Ht 65.5 in | Wt 164.0 lb

## 2014-04-15 DIAGNOSIS — R202 Paresthesia of skin: Secondary | ICD-10-CM

## 2014-04-15 DIAGNOSIS — M545 Low back pain, unspecified: Secondary | ICD-10-CM

## 2014-04-15 DIAGNOSIS — N390 Urinary tract infection, site not specified: Secondary | ICD-10-CM

## 2014-04-15 DIAGNOSIS — R81 Glycosuria: Secondary | ICD-10-CM

## 2014-04-15 DIAGNOSIS — R209 Unspecified disturbances of skin sensation: Secondary | ICD-10-CM

## 2014-04-15 LAB — POCT URINALYSIS DIPSTICK
Bilirubin, UA: NEGATIVE
GLUCOSE UA: NEGATIVE
Ketones, UA: NEGATIVE
Leukocytes, UA: NEGATIVE
NITRITE UA: NEGATIVE
PH UA: 7
PROTEIN UA: NEGATIVE
RBC UA: NEGATIVE
Spec Grav, UA: 1.015
UROBILINOGEN UA: 0.2

## 2014-04-15 LAB — POCT UA - MICROSCOPIC ONLY
CASTS, UR, LPF, POC: NEGATIVE
CRYSTALS, UR, HPF, POC: NEGATIVE
Mucus, UA: NEGATIVE
YEAST UA: NEGATIVE

## 2014-04-15 LAB — GLUCOSE, POCT (MANUAL RESULT ENTRY): POC Glucose: 102 mg/dL — AB (ref 70–99)

## 2014-04-15 MED ORDER — CYCLOBENZAPRINE HCL 5 MG PO TABS: ORAL_TABLET | ORAL | Status: DC

## 2014-04-15 MED ORDER — PREDNISONE 20 MG PO TABS: ORAL_TABLET | ORAL | Status: DC

## 2014-04-15 NOTE — Progress Notes (Signed)
This chart was scribed for Karen StaggersJeffrey Babyboy Loya, MD by Karen Watts, ED Scribe. This patient was seen in room 5 and the patient's care was started at 7:59 PM.  Subjective:    Patient ID: Karen Watts, female    DOB: Sep 20, 1966, 48 y.o.   MRN: [id number]  Chief Complaint  Patient presents with  . Back Pain    x 4 weeks  . Follow-up    UTI post antibiotic, frequent urination     HPI   ID: Karen Burdockamela G Standish is a 48 y.o. female  PCP: No PCP Per Patient  Pt last seen by Dr. Conley RollsLe a few weeks ago for upper back pain and right sided pelvic pain. WBC were noted on UA. Pt treated for UTI with Cipro 500 mg BD for 10 days. Her urine culture however had no growth. She did have 100 glucose on her urine, but A1C was 4.8. Pt here for f/u.  She states her back pain has not gotten any better, and she's beginning to feel tingling in her right toes and numbness in her right leg that was not present when she saw Dr. Conley RollsLe. Pt states her pain began as upper back pain that onset about 4 weeks ago. Pt denies any injury or trauma. She states she just woke up with it one morning. She's noted lower back pain for the last 3 weeks. Pt denies saddle anesthesia, loss of bowel and bladder control, loss of strength in the extremities. Pt reports she does occasionally have tingling in her right hand for the last few days. She states she has been taking aleve, tylenol, and advil without significant improvement of her back pain. Pt reports attempting to exercise a small amount by biking but states that by the end of her bike ride, she's in pain.  Pt states she finished the Cipro course. She notes no change in urination frequency but states she has more energy. Pt denies vaginal discharge and burning. She states she has begun to drink more water in an effort to stay hydrated but denies feeling intensely thirsty.   There are no active problems to display for this patient.   Past Medical History  Diagnosis Date  .  Allergy   . Anemia   . Blood transfusion without reported diagnosis     Past Surgical History  Procedure Laterality Date  . Appendectomy    . Hernia repair    . Endometrial ablation Bilateral 2005    No Known Allergies   Prior to Admission medications   Medication Sig Start Date End Date Taking? Authorizing Provider  acetaminophen (TYLENOL) 500 MG tablet Take 500 mg by mouth every 6 (six) hours as needed for pain.   Yes Historical Provider, MD  diphenhydrAMINE (BENADRYL) 25 MG tablet Take 25 mg by mouth every 6 (six) hours as needed for itching.   Yes Historical Provider, MD  ciprofloxacin (CIPRO) 500 MG tablet Take 1 tablet (500 mg total) by mouth 2 (two) times daily. 03/31/14   Thao P Le, DO  naproxen sodium (ANAPROX) 220 MG tablet Take 220 mg by mouth 2 (two) times daily with a meal.    Historical Provider, MD  phenazopyridine (PYRIDIUM) 200 MG tablet Take 1 tablet (200 mg total) by mouth 3 (three) times daily as needed for pain. 04/06/14   Thao P Le, DO    History   Social History  . Marital Status: Married    Spouse Name: N/A    Number of Children:  N/A  . Years of Education: N/A   Occupational History  . Not on file.   Social History Main Topics  . Smoking status: Never Smoker   . Smokeless tobacco: Not on file  . Alcohol Use: No  . Drug Use: No  . Sexual Activity: Yes   Other Topics Concern  . Not on file   Social History Narrative  . No narrative on file    Review of Systems  Constitutional: Negative for fever, chills and fatigue.  HENT: Negative for congestion, rhinorrhea and sore throat.   Eyes: Negative for photophobia, pain and visual disturbance.  Respiratory: Negative for cough, shortness of breath and wheezing.   Cardiovascular: Negative for chest pain and leg swelling.  Gastrointestinal: Negative for nausea, vomiting and abdominal pain.  Endocrine: Positive for polyuria. Negative for polydipsia and polyphagia.  Genitourinary: Negative for dysuria,  urgency, frequency, hematuria, flank pain, vaginal bleeding, vaginal discharge and vaginal pain.  Musculoskeletal: Positive for back pain. Negative for neck pain and neck stiffness.  Skin: Negative for rash.  Neurological: Positive for numbness. Negative for weakness and headaches.     Objective:  Physical Exam  Nursing note and vitals reviewed. Constitutional: She is oriented to person, place, and time. She appears well-developed and well-nourished. No distress.  HENT:  Head: Normocephalic and atraumatic.  Eyes: Conjunctivae and EOM are normal. No scleral icterus.  Neck: Neck supple. No thyromegaly present.  Cardiovascular: Normal rate, regular rhythm and normal heart sounds.  Exam reveals no gallop and no friction rub.   No murmur heard. Pulses:      Dorsalis pedis pulses are 2+ on the right side, and 2+ on the left side.  Pulmonary/Chest: Effort normal. No stridor. No respiratory distress. She has no wheezes. She has no rales. She exhibits no tenderness.  Abdominal: Soft. Bowel sounds are normal. She exhibits no distension. There is no tenderness. There is no rebound and no CVA tenderness.  Musculoskeletal: Normal range of motion. She exhibits no edema.       Right foot: She exhibits normal capillary refill.       Left foot: She exhibits normal capillary refill.  Tenderness to the right lower paraspinal and in the right SI joint. Cervical spine and midline non tender. CR<2s bilaterally  Lymphadenopathy:    She has no cervical adenopathy.  Neurological: She is alert and oriented to person, place, and time. She exhibits normal muscle tone. Coordination normal. She displays no Babinski's sign on the right side. She displays no Babinski's sign on the left side.  SLR negative with pain only in right lower spine. Able to heel and toe walk without difficulty.  Skin: Skin is warm and dry. No rash noted. She is not diaphoretic. No erythema.  Psychiatric: She has a normal mood and affect. Her  behavior is normal.    Triage Vitals: BP 120/60  Pulse 57  Temp(Src) 97.7 F (36.5 C)  Resp 16  Ht 5' 5.5" (1.664 m)  Wt 164 lb (74.39 kg)  BMI 26.87 kg/m2  SpO2 99%   Results for orders placed in visit on 04/15/14  POCT URINALYSIS DIPSTICK      Result Value Ref Range   Color, UA yellow     Clarity, UA clear     Glucose, UA neg     Bilirubin, UA neg     Ketones, UA neg     Spec Grav, UA 1.015     Blood, UA neg     pH, UA  7.0     Protein, UA neg     Urobilinogen, UA 0.2     Nitrite, UA neg     Leukocytes, UA Negative    POCT UA - MICROSCOPIC ONLY      Result Value Ref Range   WBC, Ur, HPF, POC 0-1     RBC, urine, microscopic 0-1     Bacteria, U Microscopic trace     Mucus, UA neg     Epithelial cells, urine per micros 0-2     Crystals, Ur, HPF, POC neg     Casts, Ur, LPF, POC neg     Yeast, UA neg    GLUCOSE, POCT (MANUAL RESULT ENTRY)      Result Value Ref Range   POC Glucose 102 (*) 70 - 99 mg/dl  nonfasting.    [facility] reading (PRIMARY) by  Dr. Neva Seat: Lumbar spine decreased disk space L3-4 with questionable minimal anterolisthesis. No apparent pars defect.  Over read/report noted - no acute findings and disc spaces appear maintained.  Assessment & Plan:   LATTIE RIEGE is a 48 y.o. female Right low back pain Paresthesia/pain down anterior right leg. - Plan: POCT glucose (manual entry), DG Lumbar Spine Complete, predniSONE (DELTASONE) 20 MG tablet, cyclobenzaprine (FLEXERIL) 5 MG tablet- Plan: DG Lumbar Spine Complete, predniSONE (DELTASONE) 20 MG tablet, cyclobenzaprine (FLEXERIL) 5 MG tablet  - now 4+ weeks of pain and with radiation to leg - discogenic/HNP possible.  No relief with otc nsaid.  Trial of flexeril and prednisone taper - SED.  Back care manual and if not improving in next week or two - consider MRI. Rtc/er precautions.   UTI (urinary tract infection) - Plan: POCT urinalysis dipstick, POCT UA - Microscopic  - resolved. Prior cx may have been  negative as had already had taken antibiotic at time of collection prior. Glycosuria resolved and poct glucose ok nonfasting.  Meds ordered this encounter  Medications  . predniSONE (DELTASONE) 20 MG tablet    Sig: 3 tabs by mouth each day for 2 days, 2 tabs by mouth each day for 2 days, 1 tab by mouth each day for 2 days, 1/2 tab by mouth each day for 2 days.    Dispense:  13 tablet    Refill:  0  . cyclobenzaprine (FLEXERIL) 5 MG tablet    Sig: 1 pill by mouth up to every 8 hours as needed. Start with one pill by mouth each bedtime as needed due to sedation    Dispense:  15 tablet    Refill:  0   Patient Instructions  Heat or ice to lower back, range of motion. See back care manual.  Start prednisone (no over the counter NSAIDS) when taking this. Flexeril as discussed. Return to the clinic or go to the nearest emergency room if any of your symptoms worsen or new symptoms occur. If not improving next week - may need MRI.   Back Pain, Adult Low back pain is very common. About 1 in 5 people have back pain.The cause of low back pain is rarely dangerous. The pain often gets better over time.About half of people with a sudden onset of back pain feel better in just 2 weeks. About 8 in 10 people feel better by 6 weeks.  CAUSES Some common causes of back pain include:  Strain of the muscles or ligaments supporting the spine.  Wear and tear (degeneration) of the spinal discs.  Arthritis.  Direct injury to the back. DIAGNOSIS  Most of the time, the direct cause of low back pain is not known.However, back pain can be treated effectively even when the exact cause of the pain is unknown.Answering your caregiver's questions about your overall health and symptoms is one of the most accurate ways to make sure the cause of your pain is not dangerous. If your caregiver needs more information, he or she may order lab work or imaging tests (X-rays or MRIs).However, even if imaging tests show changes in  your back, this usually does not require surgery. HOME CARE INSTRUCTIONS For many people, back pain returns.Since low back pain is rarely dangerous, it is often a condition that people can learn to [facility] their own.   Remain active. It is stressful on the back to sit or stand in one place. Do not sit, drive, or stand in one place for more than 30 minutes at a time. Take short walks on level surfaces as soon as pain allows.Try to increase the length of time you walk each day.  Do not stay in bed.Resting more than 1 or 2 days can delay your recovery.  Do not avoid exercise or work.Your body is made to move.It is not dangerous to be active, even though your back may hurt.Your back will likely heal faster if you return to being active before your pain is gone.  Pay attention to your body when you bend and lift. Many people have less discomfortwhen lifting if they bend their knees, keep the load close to their bodies,and avoid twisting. Often, the most comfortable positions are those that put less stress on your recovering back.  Find a comfortable position to sleep. Use a firm mattress and lie on your side with your knees slightly bent. If you lie on your back, put a pillow under your knees.  Only take over-the-counter or prescription medicines as directed by your caregiver. Over-the-counter medicines to reduce pain and inflammation are often the most helpful.Your caregiver may prescribe muscle relaxant drugs.These medicines help dull your pain so you can more quickly return to your normal activities and healthy exercise.  Put ice on the injured area.  Put ice in a plastic bag.  Place a towel between your skin and the bag.  Leave the ice on for 15-20 minutes, 03-04 times a day for the first 2 to 3 days. After that, ice and heat may be alternated to reduce pain and spasms.  Ask your caregiver about trying back exercises and gentle massage. This may be of some benefit.  Avoid  feeling anxious or stressed.Stress increases muscle tension and can worsen back pain.It is important to recognize when you are anxious or stressed and learn ways to manage it.Exercise is a great option. SEEK MEDICAL CARE IF:  You have pain that is not relieved with rest or medicine.  You have pain that does not improve in 1 week.  You have new symptoms.  You are generally not feeling well. SEEK IMMEDIATE MEDICAL CARE IF:   You have pain that radiates from your back into your legs.  You develop new bowel or bladder control problems.  You have unusual weakness or numbness in your arms or legs.  You develop nausea or vomiting.  You develop abdominal pain.  You feel faint. Document Released: 11/12/2005 Document Revised: 05/13/2012 Document Reviewed: 04/02/2011 [facility] Patient Information 2014 [place], [place].     I personally performed the services described in this documentation, which was scribed in my presence. The recorded information has been reviewed and  considered, and addended by me as needed.

## 2014-04-15 NOTE — Patient Instructions (Signed)
Heat or ice to lower back, range of motion. See back care manual.  Start prednisone (no over the counter NSAIDS) when taking this. Flexeril as discussed. Return to the clinic or go to the nearest emergency room if any of your symptoms worsen or new symptoms occur. If not improving next week - may need MRI.   Back Pain, Adult Low back pain is very common. About 1 in 5 people have back pain.The cause of low back pain is rarely dangerous. The pain often gets better over time.About half of people with a sudden onset of back pain feel better in just 2 weeks. About 8 in 10 people feel better by 6 weeks.  CAUSES Some common causes of back pain include:  Strain of the muscles or ligaments supporting the spine.  Wear and tear (degeneration) of the spinal discs.  Arthritis.  Direct injury to the back. DIAGNOSIS Most of the time, the direct cause of low back pain is not known.However, back pain can be treated effectively even when the exact cause of the pain is unknown.Answering your caregiver's questions about your overall health and symptoms is one of the most accurate ways to make sure the cause of your pain is not dangerous. If your caregiver needs more information, he or she may order lab work or imaging tests (X-rays or MRIs).However, even if imaging tests show changes in your back, this usually does not require surgery. HOME CARE INSTRUCTIONS For many people, back pain returns.Since low back pain is rarely dangerous, it is often a condition that people can learn to [facility] their own.   Remain active. It is stressful on the back to sit or stand in one place. Do not sit, drive, or stand in one place for more than 30 minutes at a time. Take short walks on level surfaces as soon as pain allows.Try to increase the length of time you walk each day.  Do not stay in bed.Resting more than 1 or 2 days can delay your recovery.  Do not avoid exercise or work.Your body is made to move.It is not  dangerous to be active, even though your back may hurt.Your back will likely heal faster if you return to being active before your pain is gone.  Pay attention to your body when you bend and lift. Many people have less discomfortwhen lifting if they bend their knees, keep the load close to their bodies,and avoid twisting. Often, the most comfortable positions are those that put less stress on your recovering back.  Find a comfortable position to sleep. Use a firm mattress and lie on your side with your knees slightly bent. If you lie on your back, put a pillow under your knees.  Only take over-the-counter or prescription medicines as directed by your caregiver. Over-the-counter medicines to reduce pain and inflammation are often the most helpful.Your caregiver may prescribe muscle relaxant drugs.These medicines help dull your pain so you can more quickly return to your normal activities and healthy exercise.  Put ice on the injured area.  Put ice in a plastic bag.  Place a towel between your skin and the bag.  Leave the ice on for 15-20 minutes, 03-04 times a day for the first 2 to 3 days. After that, ice and heat may be alternated to reduce pain and spasms.  Ask your caregiver about trying back exercises and gentle massage. This may be of some benefit.  Avoid feeling anxious or stressed.Stress increases muscle tension and can worsen back pain.It is  important to recognize when you are anxious or stressed and learn ways to manage it.Exercise is a great option. SEEK MEDICAL CARE IF:  You have pain that is not relieved with rest or medicine.  You have pain that does not improve in 1 week.  You have new symptoms.  You are generally not feeling well. SEEK IMMEDIATE MEDICAL CARE IF:   You have pain that radiates from your back into your legs.  You develop new bowel or bladder control problems.  You have unusual weakness or numbness in your arms or legs.  You develop nausea or  vomiting.  You develop abdominal pain.  You feel faint. Document Released: 11/12/2005 Document Revised: 05/13/2012 Document Reviewed: 04/02/2011 [facility] Patient Information 2014 [place], [place].

## 2014-04-23 ENCOUNTER — Telehealth: Payer: Self-pay

## 2014-04-23 NOTE — Telephone Encounter (Signed)
Patient saw Dr. Neva Seat on the 21st of this month and finished her prednisone, she is still having back issues. She was wondering if she can do another round of prednisone for this. Please advise. Dr. Neva Seat is also out of the office for 3 days. Please someone advise patient on whether someone can refill this or to RTC. Thank you.   Best: [phone]

## 2014-04-23 NOTE — Telephone Encounter (Signed)
Please advise patient to RTC for re-evaluation.  She may need an MRI.

## 2014-04-26 NOTE — Telephone Encounter (Signed)
Pt called, I read her the previous message From Chelle, and pt said she would consider making an appt.

## 2014-05-04 ENCOUNTER — Ambulatory Visit (INDEPENDENT_AMBULATORY_CARE_PROVIDER_SITE_OTHER): Payer: BC Managed Care – PPO | Admitting: Family Medicine

## 2014-05-04 VITALS — BP 116/78 | HR 82 | Temp 98.5°F | Resp 16 | Ht 64.5 in | Wt 157.2 lb

## 2014-05-04 DIAGNOSIS — L42 Pityriasis rosea: Secondary | ICD-10-CM

## 2014-05-04 MED ORDER — TRIAMCINOLONE ACETONIDE 0.1 % EX CREA: 1.0000 "application " | TOPICAL_CREAM | Freq: Three times a day (TID) | CUTANEOUS | Status: DC

## 2014-05-04 NOTE — Patient Instructions (Signed)

## 2014-05-04 NOTE — Progress Notes (Signed)
Subjective:  This chart was scribed for Elvina Sidle, MD  by Ashley Jacobs, [facility] Scribe. The patient was seen in room and the patient's care was started at 12:34 PM.  Patient ID: Karen Watts, female    DOB: [birth year], 48 y.o.   MRN: [id number] Chief Complaint  Patient presents with   Rash    underneath breasts, on back, itching x8 days   Rash   HPI Comments: Karen Watts is a 48 y.o. female who arrives to the [facility] complaining of itcyhing rash under her breast, onset eight days ago. The rash is now to her abdomen, bialateral arms, chest and back.  The rash is not scaly they are small red papules.She tried taking topical cream and allergy medication Pt denies trying cortisone cream. Nothing seems to help.   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  There are no active problems to display for this patient.  Past Medical History  Diagnosis Date   Allergy    Anemia    Blood transfusion without reported diagnosis    Past Surgical History  Procedure Laterality Date   Appendectomy     Hernia repair     Endometrial ablation Bilateral 2005   No Known Allergies Prior to Admission medications   Medication Sig Start Date End Date Taking? Authorizing Provider  acetaminophen (TYLENOL) 500 MG tablet Take 500 mg by mouth every 6 (six) hours as needed for pain.   Yes Historical Provider, MD  diphenhydrAMINE (BENADRYL) 25 MG tablet Take 25 mg by mouth every 6 (six) hours as needed for itching.   Yes Historical Provider, MD  loratadine (CLARITIN) 10 MG tablet Take 10 mg by mouth daily.   Yes Historical Provider, MD  naproxen sodium (ANAPROX) 220 MG tablet Take 220 mg by mouth 2 (two) times daily with a meal.    Historical Provider, MD   History   Social History   Marital Status: Married    Spouse Name: N/A    Number of Children: N/A   Years of Education: N/A   Occupational History   Not on file.    Social History Main Topics   Smoking status: Never Smoker    Smokeless tobacco: Not on file   Alcohol Use: Yes     Comment: occasionally   Drug Use: No   Sexual Activity: Yes   Other Topics Concern   Not on file   Social History Narrative   No narrative on file    Review of Systems  Skin: Positive for color change (red) and rash.       Objective:   Physical Exam  Nursing note and vitals reviewed. Constitutional: She is oriented to person, place, and time. She appears well-developed and well-nourished.  HENT:  Head: Normocephalic.  Eyes: Pupils are equal, round, and reactive to light.  Neck: Normal range of motion.  Cardiovascular: Normal rate.   Pulmonary/Chest: Effort normal.  Neurological: She is alert and oriented to person, place, and time.  Skin: Skin is warm and dry. Rash noted. She is not diaphoretic. There is erythema.    Filed Vitals:   05/04/14 1149  BP: 116/78  Pulse: 82  Temp: 98.5 F (36.9 C)  TempSrc: Oral  Resp: 16  Height: 5' 4.5" (1.638 m)  Weight: 157 lb 3.2 oz (71.305 kg)  SpO2: 99%   DIAGNOSTIC STUDIES: Oxygen Saturation is 99% on room air, normal by  my interpretation.    COORDINATION OF CARE:  12:37 PM Discussed course of care with pt which includes treatment for pityriasis rosea. I will give her cortisone cream and cool showers . Pt understands and agrees.  Multiple ellipsoid salmon-colored lesions over the trunk in the dermatomal distribution of pityriasis rosea.      Assessment & Plan:  Pityriasis rosea - Plan: triamcinolone cream (KENALOG) 0.1 %  Signed, Elvina SidleKurt Lauenstein, MD

## 2014-10-19 ENCOUNTER — Ambulatory Visit (INDEPENDENT_AMBULATORY_CARE_PROVIDER_SITE_OTHER): Payer: BC Managed Care – PPO | Admitting: Internal Medicine

## 2014-10-19 VITALS — BP 122/78 | HR 85 | Temp 98.0°F | Resp 16 | Ht 65.5 in | Wt 161.0 lb

## 2014-10-19 DIAGNOSIS — S39012S Strain of muscle, fascia and tendon of lower back, sequela: Secondary | ICD-10-CM

## 2014-10-19 DIAGNOSIS — M545 Low back pain, unspecified: Secondary | ICD-10-CM

## 2014-10-19 DIAGNOSIS — H9202 Otalgia, left ear: Secondary | ICD-10-CM

## 2014-10-19 DIAGNOSIS — H6122 Impacted cerumen, left ear: Secondary | ICD-10-CM

## 2014-10-19 MED ORDER — HYDROCODONE-ACETAMINOPHEN 5-325 MG PO TABS: 1.0000 | ORAL_TABLET | Freq: Four times a day (QID) | ORAL | Status: DC | PRN

## 2014-10-19 MED ORDER — METHOCARBAMOL 750 MG PO TABS: 750.0000 mg | ORAL_TABLET | Freq: Four times a day (QID) | ORAL | Status: DC

## 2014-10-19 MED ORDER — PREDNISONE 10 MG PO TABS: ORAL_TABLET | ORAL | Status: DC

## 2014-10-19 NOTE — Progress Notes (Deleted)
   Subjective:    Patient ID: Karen Watts, female    DOB: 1966/11/04, 48 y.o.   MRN: [id number]  HPI    Review of Systems     Objective:   Physical Exam        Assessment & Plan:

## 2014-10-19 NOTE — Patient Instructions (Signed)
Back Pain, Adult °Low back pain is very common. About 1 in 5 people have back pain. The cause of low back pain is rarely dangerous. The pain often gets better over time. About half of people with a sudden onset of back pain feel better in just 2 weeks. About 8 in 10 people feel better by 6 weeks.  °CAUSES °Some common causes of back pain include: °· Strain of the muscles or ligaments supporting the spine. °· Wear and tear (degeneration) of the spinal discs. °· Arthritis. °· Direct injury to the back. °DIAGNOSIS °Most of the time, the direct cause of low back pain is not known. However, back pain can be treated effectively even when the exact cause of the pain is unknown. Answering your caregiver's questions about your overall health and symptoms is one of the most accurate ways to make sure the cause of your pain is not dangerous. If your caregiver needs more information, he or she may order lab work or imaging tests (X-rays or MRIs). However, even if imaging tests show changes in your back, this usually does not require surgery. °HOME CARE INSTRUCTIONS °For many people, back pain returns. Since low back pain is rarely dangerous, it is often a condition that people can learn to manage on their own.  °· Remain active. It is stressful on the back to sit or stand in one place. Do not sit, drive, or stand in one place for more than 30 minutes at a time. Take short walks on level surfaces as soon as pain allows. Try to increase the length of time you walk each day. °· Do not stay in bed. Resting more than 1 or 2 days can delay your recovery. °· Do not avoid exercise or work. Your body is made to move. It is not dangerous to be active, even though your back may hurt. Your back will likely heal faster if you return to being active before your pain is gone. °· Pay attention to your body when you  bend and lift. Many people have less discomfort when lifting if they bend their knees, keep the load close to their bodies, and  avoid twisting. Often, the most comfortable positions are those that put less stress on your recovering back. °· Find a comfortable position to sleep. Use a firm mattress and lie on your side with your knees slightly bent. If you lie on your back, put a pillow under your knees. °· Only take over-the-counter or prescription medicines as directed by your caregiver. Over-the-counter medicines to reduce pain and inflammation are often the most helpful. Your caregiver may prescribe muscle relaxant drugs. These medicines help dull your pain so you can more quickly return to your normal activities and healthy exercise. °· Put ice on the injured area. °· Put ice in a plastic bag. °· Place a towel between your skin and the bag. °· Leave the ice on for 15-20 minutes, 03-04 times a day for the first 2 to 3 days. After that, ice and heat may be alternated to reduce pain and spasms. °· Ask your caregiver about trying back exercises and gentle massage. This may be of some benefit. °· Avoid feeling anxious or stressed. Stress increases muscle tension and can worsen back pain. It is important to recognize when you are anxious or stressed and learn ways to manage it. Exercise is a great option. °SEEK MEDICAL CARE IF: °· You have pain that is not relieved with rest or medicine. °· You have pain that does not improve in 1 week. °· You have new symptoms. °· You are generally not feeling well. °SEEK   IMMEDIATE MEDICAL CARE IF:  °· You have pain that radiates from your back into your legs. °· You develop new bowel or bladder control problems. °· You have unusual weakness or numbness in your arms or legs. °· You develop nausea or vomiting. °· You develop abdominal pain. °· You feel faint. °Document Released: 11/12/2005 Document Revised: 05/13/2012 Document Reviewed: 03/16/2014 °ExitCare® Patient Information ©2015 [employer]. This information is not intended to replace advice given to you by your health care provider. Make sure you  discuss any questions you have with your health care provider. ° °Back Injury Prevention °Back injuries can be extremely painful and difficult to heal. After having one back injury, you are much more likely to experience another later on. It is important to learn how to avoid injuring or re-injuring your back. The following tips can help you to prevent a back injury. °PHYSICAL FITNESS °· Exercise regularly and try to develop good tone in your abdominal muscles. Your abdominal muscles provide a lot of the support needed by your back. °· Do aerobic exercises (walking, jogging, biking, swimming) regularly. °· Do exercises that increase balance and strength (tai chi, yoga) regularly. This can decrease your risk of falling and injuring your back. °· Stretch before and after exercising. °· Maintain a healthy weight. The more you weigh, the more stress is placed on your back. For every pound of weight, 10 times that amount of pressure is placed on the back. °DIET °· Talk to your caregiver about how much calcium and vitamin D you need per day. These nutrients help to prevent weakening of the bones (osteoporosis). Osteoporosis can cause broken (fractured) bones that lead to back pain. °· Include good sources of calcium in your diet, such as dairy products, green, leafy vegetables, and products with calcium added (fortified). °· Include good sources of vitamin D in your diet, such as milk and foods that are fortified with vitamin D. °· Consider taking a nutritional supplement or a multivitamin if needed. °· Stop smoking if you smoke. °POSTURE °· Sit and stand up straight. Avoid leaning forward when you sit or hunching over when you stand. °· Choose chairs with good low back (lumbar) support. °· If you work at a desk, sit close to your work so you do not need to lean over. Keep your chin tucked in. Keep your neck drawn back and elbows bent at a right angle. Your arms should look like the letter "L." °· Sit high and close to  the steering wheel when you drive. Add a lumbar support to your car seat if needed. °· Avoid sitting or standing in one position for too long. Take breaks to get up, stretch, and walk around at least once every hour. Take breaks if you are driving for long periods of time. °· Sleep on your side with your knees slightly bent, or sleep on your back with a pillow under your knees. Do not sleep on your stomach. °LIFTING, TWISTING, AND REACHING °· Avoid heavy lifting, especially repetitive lifting. If you must do heavy lifting: °¨ Stretch before lifting. °¨ Work slowly. °¨ Rest between lifts. °¨ Use carts and dollies to move objects when possible. °¨ Make several small trips instead of carrying 1 heavy load. °¨ Ask for help when you need it. °¨ Ask for help when moving big, awkward objects. °· Follow these steps when lifting: °¨ Stand with your feet shoulder-width apart. °¨ Get as close to the object as you can. Do not try to   pick up heavy objects that are far from your body. °¨ Use handles or lifting straps if they are available. °¨ Bend at your knees. Squat down, but keep your heels off the floor. °¨ Keep your shoulders pulled back, your chin tucked in, and your back straight. °¨ Lift the object slowly, tightening the muscles in your legs, abdomen, and buttocks. Keep the object as close to the center of your body as possible. °¨ When you put a load down, use these same guidelines in reverse. °· Do not: °¨ Lift the object above your waist. °¨ Twist at the waist while lifting or carrying a load. Move your feet if you need to turn, not your waist. °¨ Bend over without bending at your knees. °· Avoid reaching over your head, across a table, or for an object on a high surface. °OTHER TIPS °· Avoid wet floors and keep sidewalks clear of ice to prevent falls. °· Do not sleep on a mattress that is too soft or too hard. °· Keep items that are used frequently within easy reach. °· Put heavier objects on shelves at waist level  and lighter objects on lower or higher shelves. °· Find ways to decrease your stress, such as exercise, massage, or relaxation techniques. Stress can build up in your muscles. Tense muscles are more vulnerable to injury. °· Seek treatment for depression or anxiety if needed. These conditions can increase your risk of developing back pain. °SEEK MEDICAL CARE IF: °· You injure your back. °· You have questions about diet, exercise, or other ways to prevent back injuries. °MAKE SURE YOU: °· Understand these instructions. °· Will watch your condition. °· Will get help right away if you are not doing well or get worse. °Document Released: 12/20/2004 Document Revised: 02/04/2012 Document Reviewed: 12/24/2011 °ExitCare® Patient Information ©2015 [employer]. This information is not intended to replace advice given to you by your health care provider. Make sure you discuss any questions you have with your health care provider. ° °

## 2014-10-19 NOTE — Progress Notes (Signed)
   Subjective:    Patient ID: Karen Watts, female    DOB: [birth year], 48 y.o.   MRN: [id number]  HPI 48 year old Caucasian female is here today with complaints of left ear pressure for the past two days. She states her left ear feels clogged and when someone is talking to her it feels like a drumming noise coming through her ear. She states she also feels a little fuzzy off balance. No history of vertigo. Presently, she states she has had some headaches, sneezing and  fo nasal congestion a couple of weeks. She states she has been taking Claritin during the day, a Sudafed yesterday and a Benadryl last night with no relief. No shortness of breath, no wheezing, no chest congestion, no fever, chest pain.    She is also here with complaints of lower back pain for the past two to three weeks. She states she has flare but this time the pain is not going away. She states she took some Aleve this morning with no relief. She states she had xrays done in May of this year. No problems with her bladder or bowels. Some numbness going down her right leg.  Had flu shot Review of Systems     Objective:   Physical Exam  Constitutional: She is oriented to person, place, and time. She appears well-developed and well-nourished. No distress.  HENT:  Head: Normocephalic.  Right Ear: Hearing, tympanic membrane, external ear and ear canal normal.  Left Ear: Hearing normal. There is tenderness.  Nose: Nose normal.  Mouth/Throat: Oropharynx is clear and moist.  Cerumen filled up against ear drum  Eyes: Conjunctivae and EOM are normal. Pupils are equal, round, and reactive to light.  Neck: Normal range of motion. Neck supple. No thyromegaly present.  Pulmonary/Chest: Effort normal.  Musculoskeletal: She exhibits tenderness.  Neurological: She is alert and oriented to person, place, and time. She exhibits abnormal muscle tone. Coordination normal.  Psychiatric: She has a normal mood and affect. Her behavior is  normal.   Irrigate ears remove adherent cerumen, stopped irrigation due to pain       Assessment & Plan:  Refer for MRI disc Refer to ENT Dr. Lovey NewcomerKrause Robaxin/Vicodin/Prednisone

## 2017-01-09 ENCOUNTER — Ambulatory Visit (INDEPENDENT_AMBULATORY_CARE_PROVIDER_SITE_OTHER): Payer: BLUE CROSS/BLUE SHIELD | Admitting: Family Medicine

## 2017-01-09 ENCOUNTER — Ambulatory Visit (INDEPENDENT_AMBULATORY_CARE_PROVIDER_SITE_OTHER): Payer: BLUE CROSS/BLUE SHIELD

## 2017-01-09 VITALS — BP 108/62 | HR 74 | Temp 98.9°F | Resp 16 | Ht 65.5 in | Wt 168.2 lb

## 2017-01-09 DIAGNOSIS — M25522 Pain in left elbow: Secondary | ICD-10-CM

## 2017-01-09 DIAGNOSIS — R6889 Other general symptoms and signs: Secondary | ICD-10-CM

## 2017-01-09 LAB — POCT INFLUENZA A/B
INFLUENZA B, POC: NEGATIVE
Influenza A, POC: NEGATIVE

## 2017-01-09 NOTE — Patient Instructions (Addendum)
Your flu test was negative.  This is good news.  Continue to treat your illness symptomatically.  I think this is just a bad virus.  Your elbow xrays were completely negative.  This is also good news.  We will treat this as tennis elbow.  Continue to take Alleve for relief.  Use the ice 20 minutes on and 20 minutes off.  Also use compression sleeves to help.  Make sure that you're doing gentle exercise so that your elbow doesn't get stiff.  If it's still bothering you in 2 weeks, call and let me know and we can refer you to a sport medicine or orthopedic doctor.   It was good to meet you today     IF you received an x-ray today, you will receive an invoice from [facility]. Please contact [facility] at [phone] with questions or concerns regarding your invoice.   IF you received labwork today, you will receive an invoice from [facility]. Please contact [facility] at [phone] with questions or concerns regarding your invoice.   Our billing staff will not be able to assist you with questions regarding bills from these companies.  You will be contacted with the lab results as soon as they are available. The fastest way to get your results is to activate your My Chart account. Instructions are located on the last page of this paperwork. If you have not heard from us regarding the results in 2 weeks, please contact this office.

## 2017-01-09 NOTE — Progress Notes (Signed)
   SUBJECTIVE: CC:  URI and left elbow pain  1. URI symptoms:  Karen Watts is a 51 y.o. female who complains of URI symptoms present for past 7 days.  Describes rhinorrhea, sinus congestion, mild cough.  Has tried OTC meds without relief.  Sick contacts are nephew and other family members with flu.  No fevers or chills. No nausea or vomiting.  Denies smoking cigarettes.  2.  Left elbow pain:   Present for the past 3 weeks. She states that she hit her elbow against a doorway. She has had pain since then. She has used once a day Aleve with relief. She is not tried any ice or compression. She has been not doing anything for physical therapy but just letting her upper rest.  No tingling in her hands. No weakness.  ROS as above.  No neck pain.   PMH reviewed. Patient is a nonsmoker.   Medications reviewed.  Physical Exam:  BP 108/62   Pulse 74   Temp 98.9 F (37.2 C) (Oral)   Resp 16   Ht 5' 5.5" (1.664 m)   Wt 168 lb 3.2 oz (76.3 kg)   SpO2 97%   BMI 27.56 kg/m  Gen:  Patient sitting on exam table, appears stated age in no acute distress Head: Normocephalic atraumatic Eyes: EOMI, PERRL, sclera and conjunctiva non-erythematous Ears:  Canals clear bilaterally.  TMs pearly gray bilaterally without erythema or bulging.   Nose:  Nasal turbinates grossly enlarged bilaterally. Some exudates noted. Tender to palpation of maxillary sinus  Mouth: Mucosa membranes moist. Tonsils +2, nonenlarged, non-erythematous. Neck: No cervical lymphadenopathy noted Heart:  RRR, no murmurs auscultated. Pulm:  Clear to auscultation bilaterally with good air movement.  No wheezes or rales noted.  Extremities: Tender to palpation directly over the left lateral epicondyles. Strength is 5 out of 5 left upper extremity. Sensation is intact distally. Olecranon is nontender. Medial condyle is also nontender  Results for orders placed or performed in visit on 01/09/17  POCT Influenza A/B  Result Value Ref Range    Influenza A, POC Negative Negative   Influenza B, POC Negative Negative     Assessment and Plan:  1.  Viral URI: - negative flu here - treat symptomatically   2.  Left lateral epicondyle pain - possible bruise versus tennis elbow.  Negative radiographs - treatment will be same either way. - Treat with NSAID, ice, rest, compression.  FU in 2 weeks if no improvement, Can consider referral to sports medicine at that point.

## 2018-01-18 IMAGING — DX DG ELBOW COMPLETE 3+V*L*
4 series · 4 of 4 positions shown · non-contrast
Comparison: None.

CLINICAL DATA: Hit left elbow 3 weeks ago.  Pain

EXAM:
LEFT ELBOW - COMPLETE 3+ VIEW

[elbow ap]
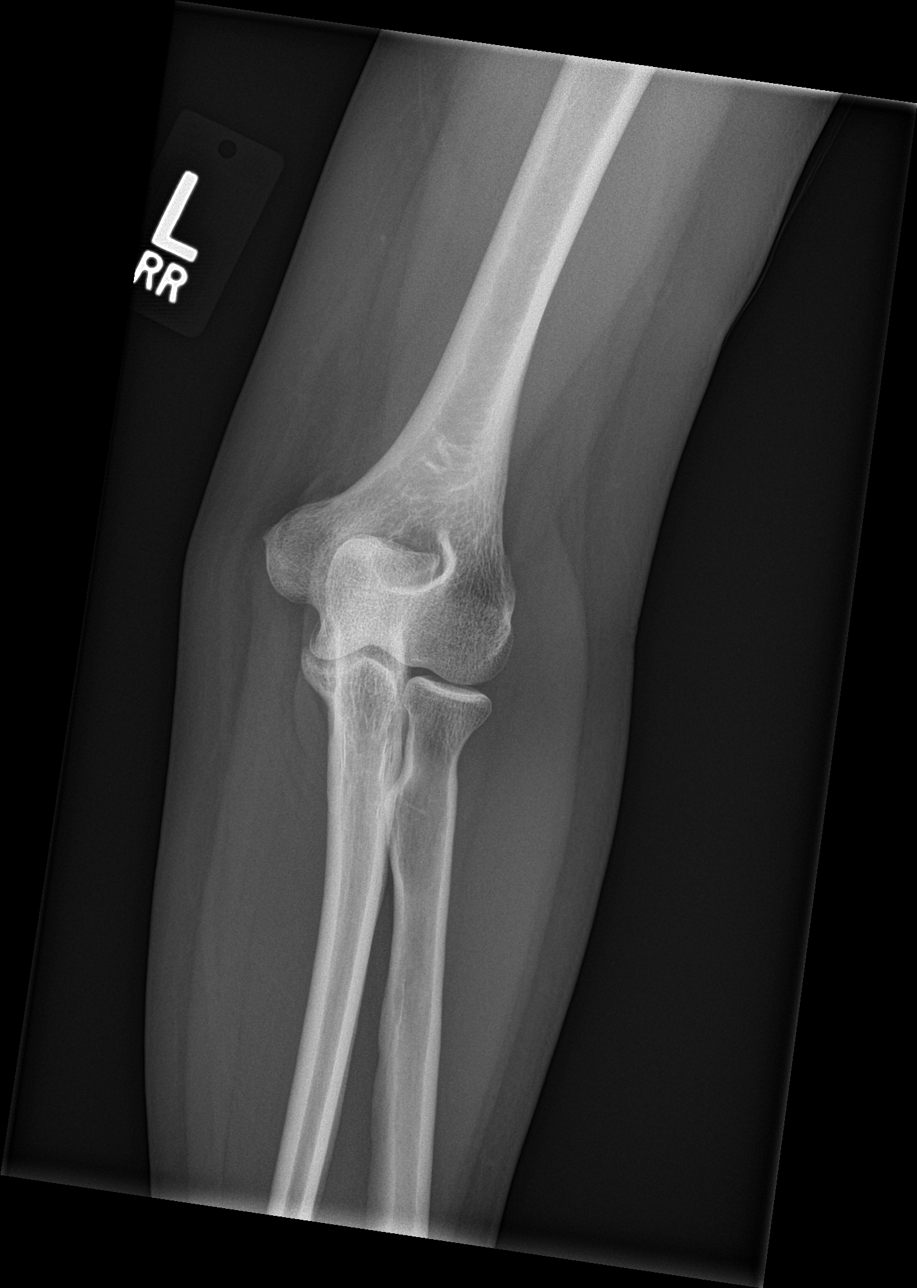

[elbow obl (1 of 2)]
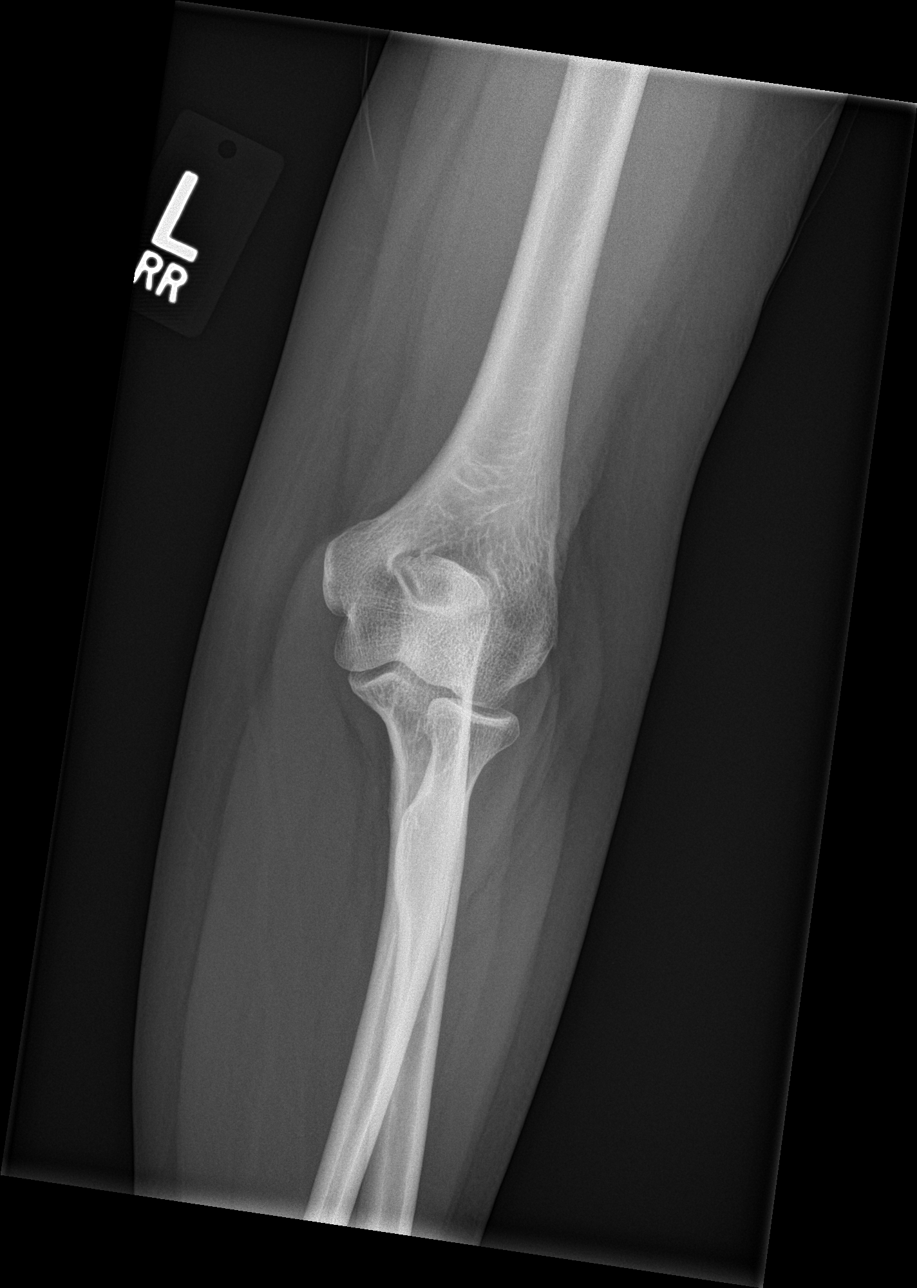

[elbow obl (2 of 2)]
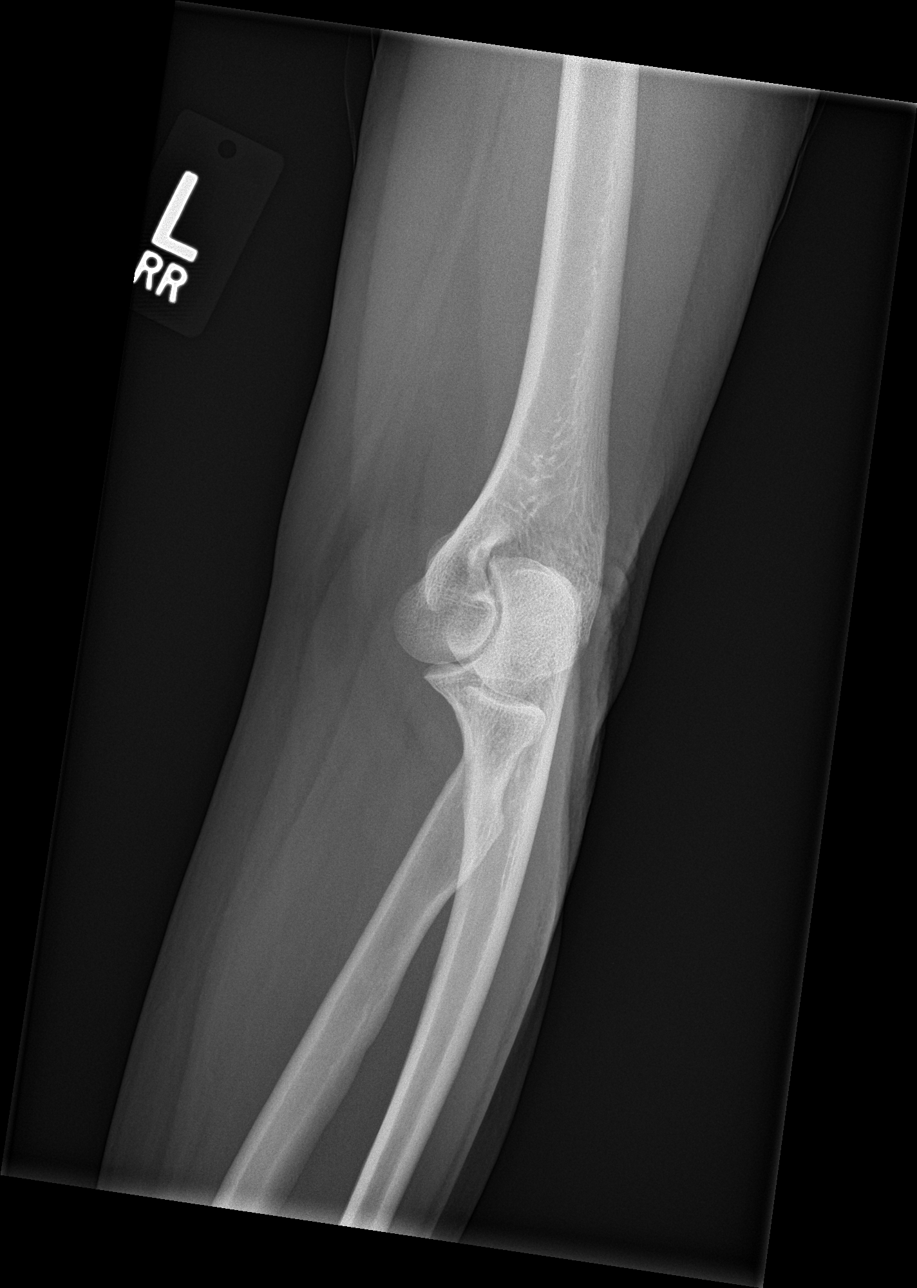

[elbow lat]
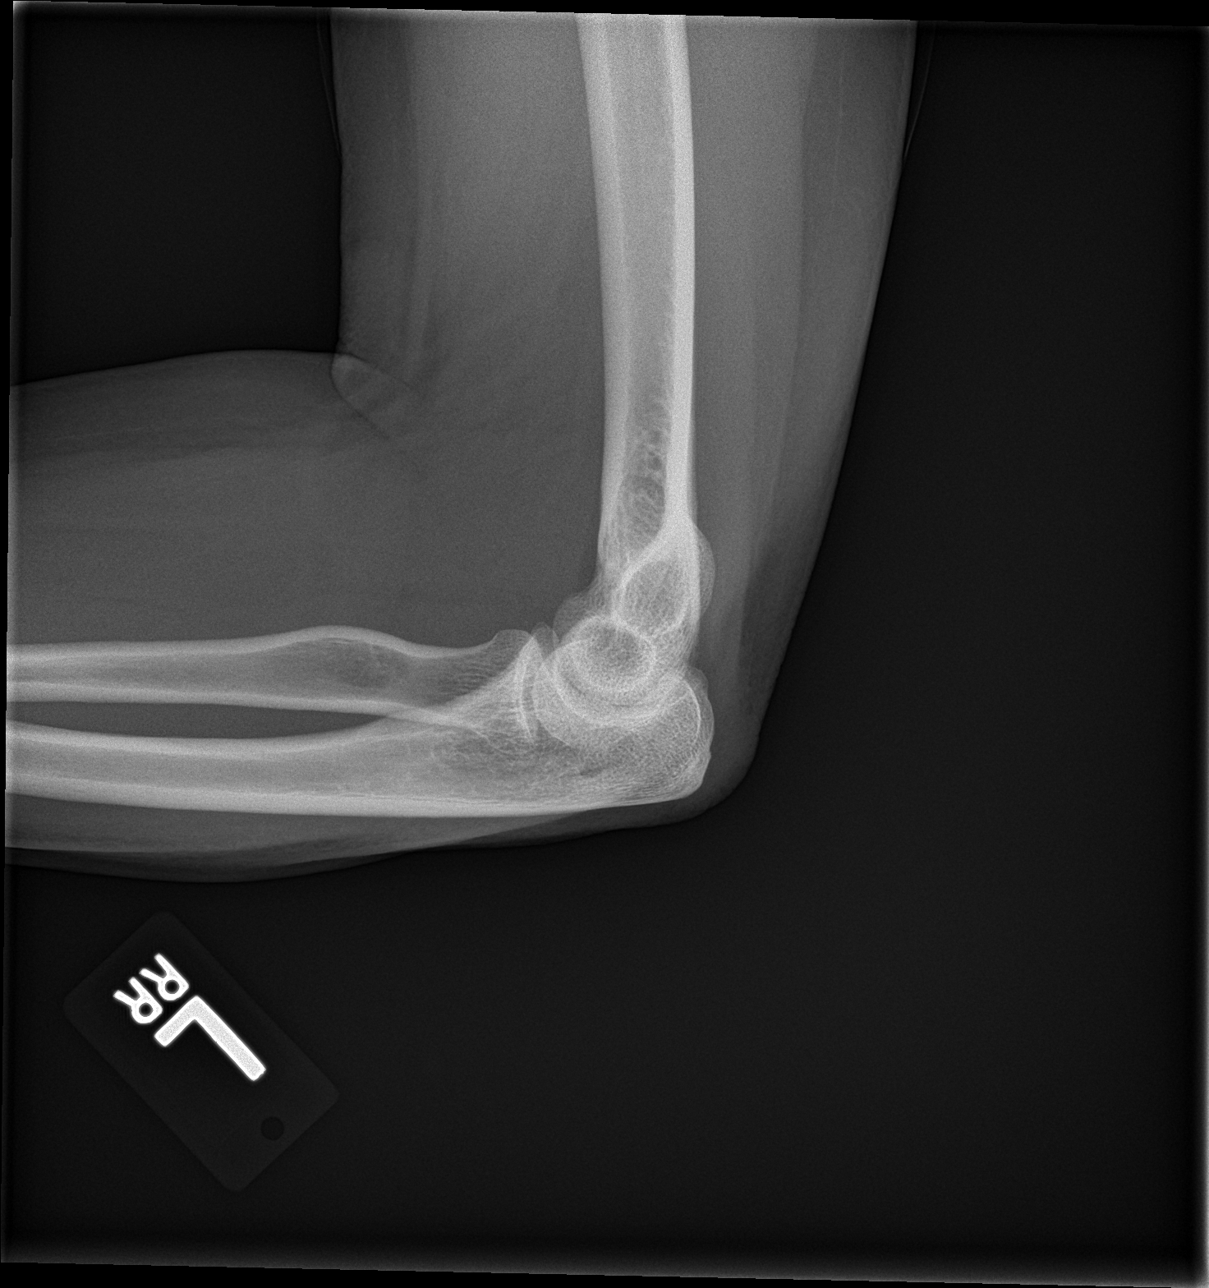

[4 of 4 positions shown; findings below may reference images not displayed]

FINDINGS: There is no evidence of fracture, dislocation, or joint effusion.
There is no evidence of arthropathy or other focal bone abnormality.
Soft tissues are unremarkable.
IMPRESSION: Negative.

## 2020-03-29 ENCOUNTER — Other Ambulatory Visit: Payer: Self-pay | Admitting: Internal Medicine

## 2020-03-29 DIAGNOSIS — E049 Nontoxic goiter, unspecified: Secondary | ICD-10-CM

## 2020-04-15 ENCOUNTER — Ambulatory Visit
Admission: RE | Admit: 2020-04-15 | Discharge: 2020-04-15 | Disposition: A | Discharge status: Home / Self Care | Payer: 59 | Source: Ambulatory Visit | Attending: Internal Medicine | Admitting: Internal Medicine

## 2020-04-15 DIAGNOSIS — E049 Nontoxic goiter, unspecified: Secondary | ICD-10-CM

## 2020-08-25 ENCOUNTER — Other Ambulatory Visit (HOSPITAL_COMMUNITY): Payer: Self-pay | Admitting: Radiology

## 2020-08-25 DIAGNOSIS — R062 Wheezing: Secondary | ICD-10-CM

## 2020-09-26 ENCOUNTER — Other Ambulatory Visit (HOSPITAL_COMMUNITY): Payer: 59

## 2020-09-29 ENCOUNTER — Other Ambulatory Visit: Payer: Self-pay

## 2020-09-29 ENCOUNTER — Ambulatory Visit (HOSPITAL_COMMUNITY)
Admission: RE | Admit: 2020-09-29 | Discharge: 2020-09-29 | Disposition: A | Discharge status: Home / Self Care | Payer: 59 | Source: Ambulatory Visit | Attending: Family Medicine | Admitting: Family Medicine

## 2020-09-29 ENCOUNTER — Encounter (HOSPITAL_COMMUNITY): Payer: 59

## 2020-09-29 DIAGNOSIS — R062 Wheezing: Secondary | ICD-10-CM | POA: Insufficient documentation

## 2020-09-29 LAB — PULMONARY FUNCTION TEST
DL/VA % pred: 112 %
DL/VA: 4.76 ml/min/mmHg/L
DLCO unc % pred: 102 %
DLCO unc: 22.04 ml/min/mmHg
FEF 25-75 Post: 3.24 L/sec
FEF 25-75 Pre: 2.89 L/sec
FEF2575-%Change-Post: 12 %
FEF2575-%Pred-Post: 120 %
FEF2575-%Pred-Pre: 107 %
FEV1-%Change-Post: 2 %
FEV1-%Pred-Post: 99 %
FEV1-%Pred-Pre: 97 %
FEV1-Post: 2.82 L
FEV1-Pre: 2.76 L
FEV1FVC-%Change-Post: 2 %
FEV1FVC-%Pred-Pre: 102 %
FEV6-%Change-Post: 0 %
FEV6-%Pred-Post: 95 %
FEV6-%Pred-Pre: 96 %
FEV6-Post: 3.34 L
FEV6-Pre: 3.36 L
FEV6FVC-%Pred-Post: 102 %
FEV6FVC-%Pred-Pre: 102 %
FVC-%Change-Post: 0 %
FVC-%Pred-Post: 92 %
FVC-%Pred-Pre: 93 %
FVC-Post: 3.34 L
FVC-Pre: 3.36 L
Post FEV1/FVC ratio: 84 %
Post FEV6/FVC ratio: 100 %
Pre FEV1/FVC ratio: 82 %
Pre FEV6/FVC Ratio: 100 %
RV % pred: 92 %
RV: 1.76 L
TLC % pred: 100 %
TLC: 5.22 L

## 2020-09-29 MED ORDER — ALBUTEROL SULFATE (2.5 MG/3ML) 0.083% IN NEBU
2.5000 mg | INHALATION_SOLUTION | Freq: Once | RESPIRATORY_TRACT | Status: AC
Start: 2020-09-29 — End: 2020-09-29
  Administered 2020-09-29: 2.5 mg via RESPIRATORY_TRACT

## 2021-03-31 ENCOUNTER — Other Ambulatory Visit: Payer: Self-pay | Admitting: Internal Medicine

## 2021-03-31 DIAGNOSIS — E042 Nontoxic multinodular goiter: Secondary | ICD-10-CM

## 2021-04-24 IMAGING — US US THYROID
1 series · 13 of 25 positions shown · non-contrast
Comparison: None.

CLINICAL DATA: Thyromegaly

EXAM:
THYROID ULTRASOUND
TECHNIQUE: Ultrasound examination of the thyroid gland and adjacent soft
tissues was performed.

[Series 1: us thyroid · 0.06mm/px · 13 of 52 slices shown]
[im 1/52]
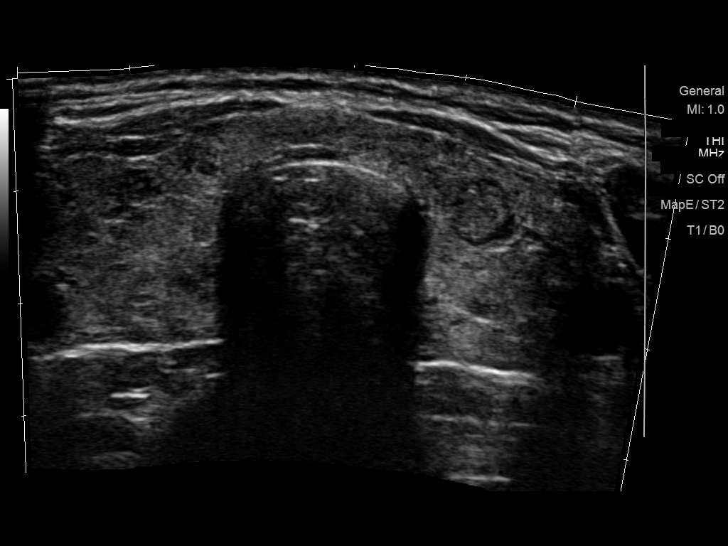
[im 5/52]
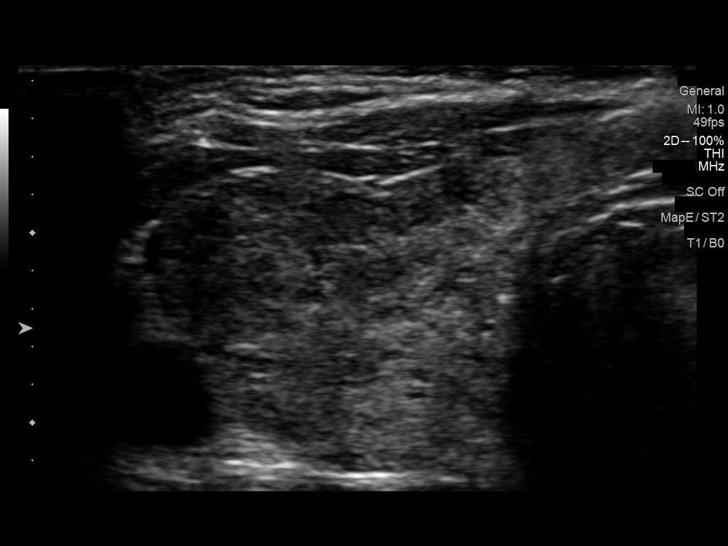
[im 9/52]
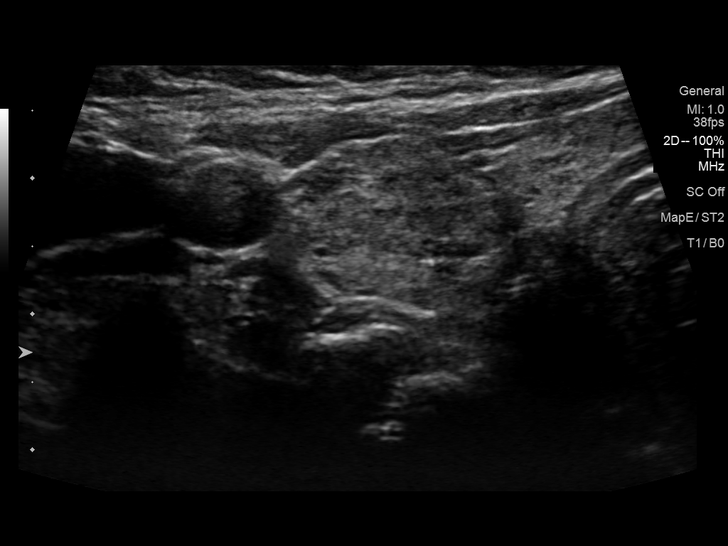
[im 13/52]
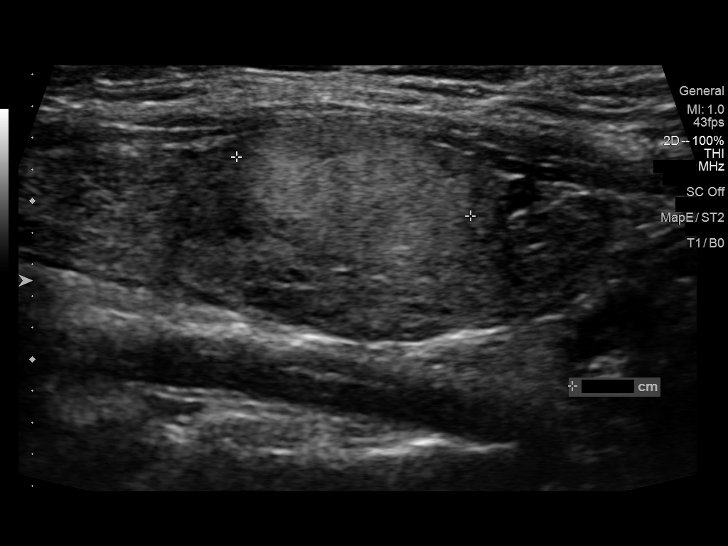
[im 18/52]
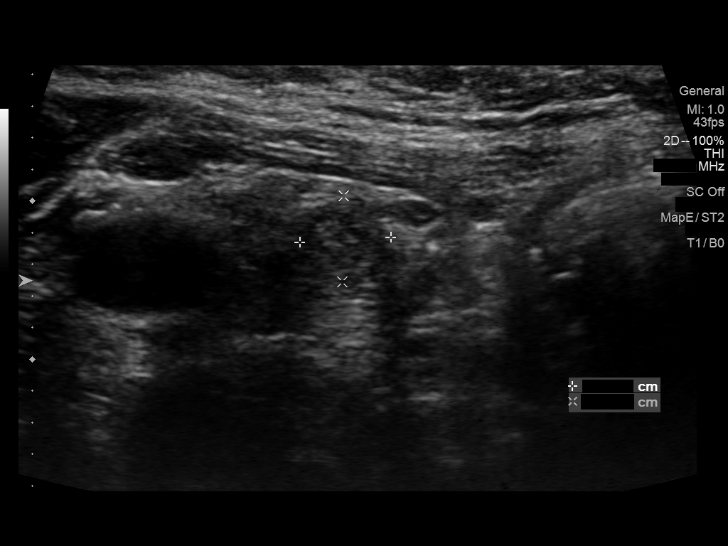
[im 22/52]
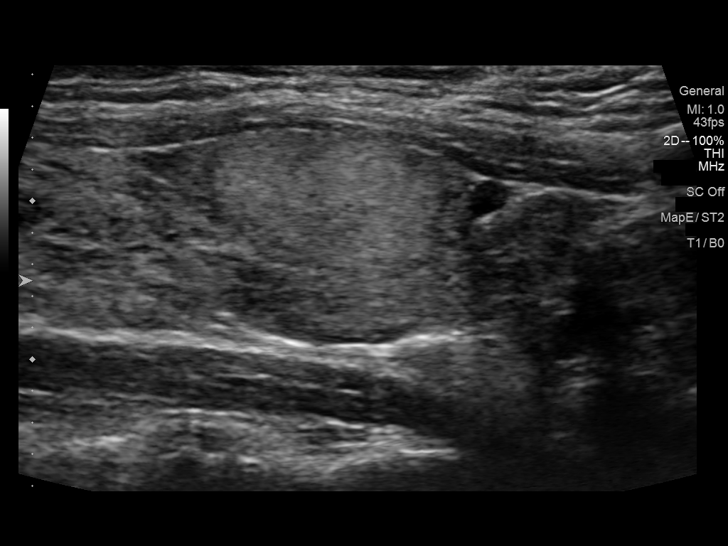
[im 26/52]
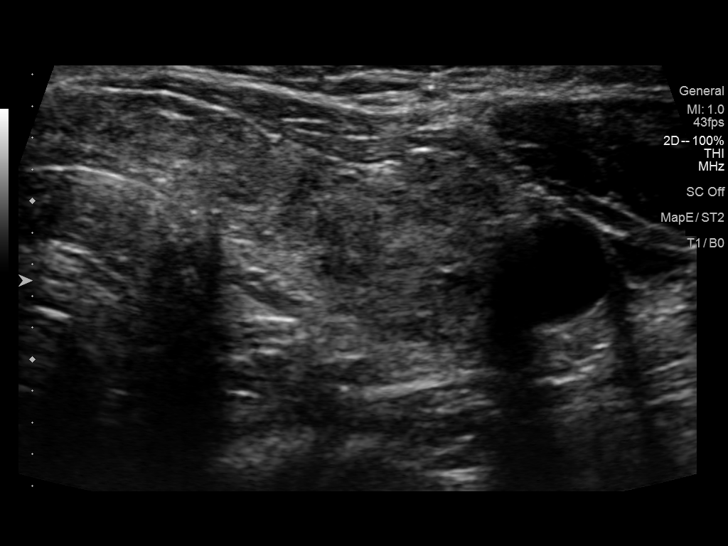
[im 30/52]
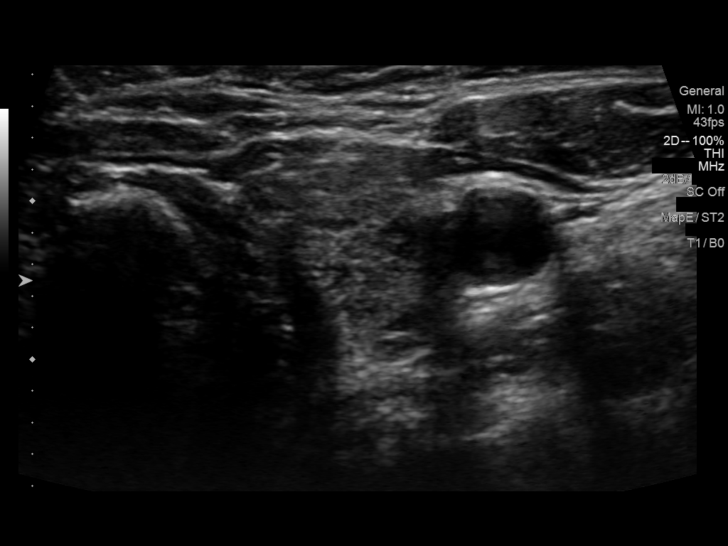
[im 35/52]
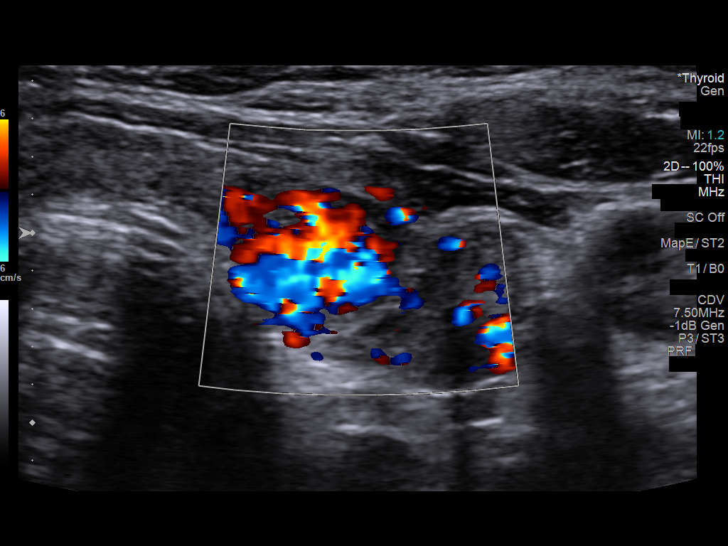
[im 39/52]
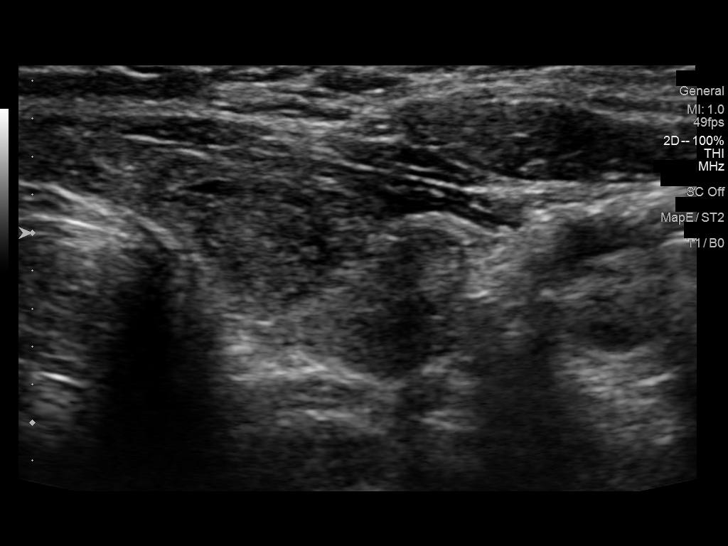
[im 43/52]
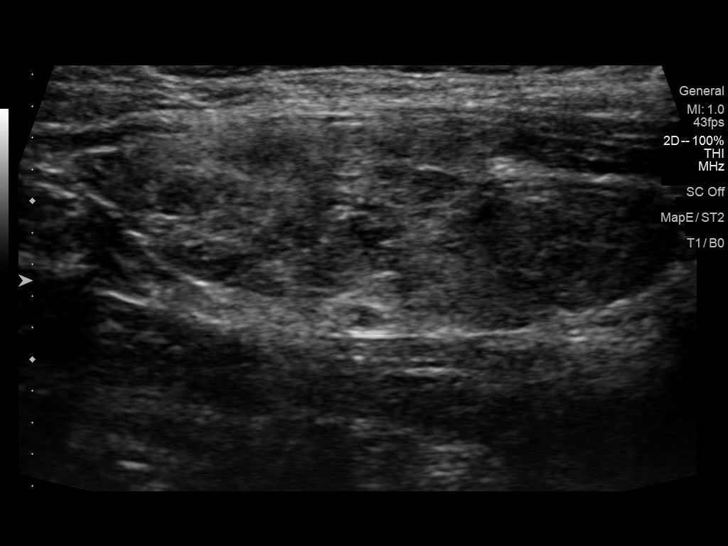
[im 47/52]
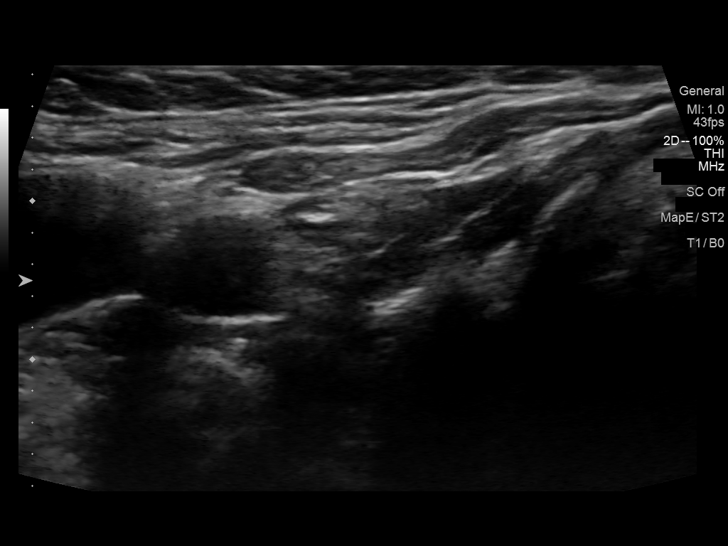
[im 52/52]
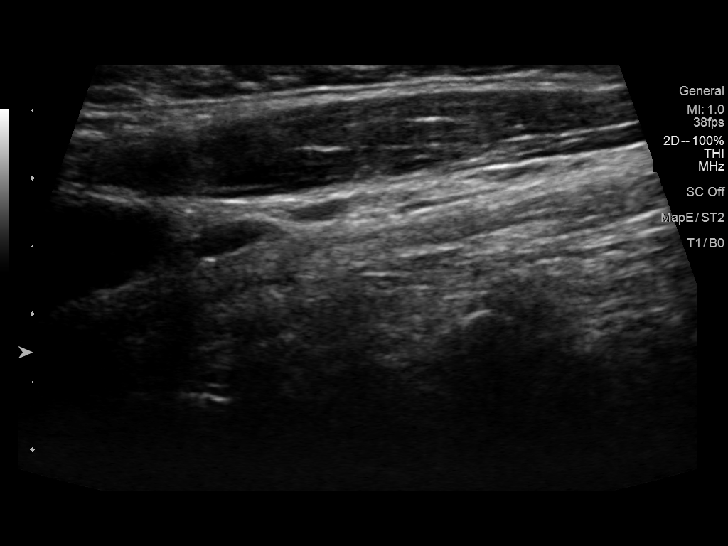

[13 of 25 positions shown; findings below may reference images not displayed]

FINDINGS: Parenchymal Echotexture: Moderately heterogenous

Isthmus: 0.4 cm thickness

Right lobe: 4 x 1.4 x 2 cm

Left lobe: 4.5 x 1.6 x 2 cm

_________________________________________________________

Estimated total number of nodules >/= 1 cm: 2

Number of spongiform nodules >/=  2 cm not described below (TR1): 0

Number of mixed cystic and solid nodules >/= 1.5 cm not described
below (TR2): 0

_________________________________________________________

Nodule # 1:

Location: Right; Inferior

Maximum size: 1.5 cm; Other 2 dimensions: 1.4 x 1 cm

Composition: solid/almost completely solid (2)

Echogenicity: hyperechoic (1)

Shape: not taller-than-wide (0)

Margins: ill-defined (0)

Echogenic foci: none (0)

ACR TI-RADS total points: 3.

ACR TI-RADS risk category: TR3 (3 points).

ACR TI-RADS recommendations:

*Given size (>/= 1.5 - 2.4 cm) and appearance, a follow-up
ultrasound in 1 year should be considered based on TI-RADS criteria.

_________________________________________________________

0.5 cm hypoechoic nodule without calcifications, inferior right;
This nodule does NOT meet TI-RADS criteria for biopsy or dedicated
follow-up.

0.6 cm isoechoic nodule without calcifications, inferior right; This
nodule does NOT meet TI-RADS criteria for biopsy or dedicated
follow-up.

Nodule # 4:

Location: Left; Inferior

Maximum size: 1.2 cm; Other 2 dimensions: 0.8 x 0.6 cm

Composition: solid/almost completely solid (2)

Echogenicity: isoechoic (1)

Shape: not taller-than-wide (0)

Margins: ill-defined (0)

Echogenic foci: none (0)

ACR TI-RADS total points: 3.

ACR TI-RADS risk category: TR3 (3 points).

ACR TI-RADS recommendations:

Given size (<1.4 cm) and appearance, this nodule does NOT meet
TI-RADS criteria for biopsy or dedicated follow-up.

_________________________________________________________

0.8 cm isoechoic nodule versus pseudonodule without calcifications,
inferior left; This nodule does NOT meet TI-RADS criteria for biopsy
or dedicated follow-up.
IMPRESSION: 1. Borderline thyromegaly with bilateral nodules. None meets scratch
criteria for biopsy.
2. Recommend annual/biennial ultrasound follow-up of right nodule as
above, until stability x5 years confirmed.

The above is in keeping with the ACR TI-RADS recommendations - [HOSPITAL] 6205;[DATE].

## 2021-05-31 ENCOUNTER — Ambulatory Visit
Admission: RE | Admit: 2021-05-31 | Discharge: 2021-05-31 | Disposition: A | Discharge status: Home / Self Care | Payer: 59 | Source: Ambulatory Visit | Attending: Internal Medicine | Admitting: Internal Medicine

## 2021-05-31 DIAGNOSIS — E042 Nontoxic multinodular goiter: Secondary | ICD-10-CM

## 2021-12-05 ENCOUNTER — Ambulatory Visit
Admission: RE | Admit: 2021-12-05 | Discharge: 2021-12-05 | Disposition: A | Discharge status: Home / Self Care | Payer: Commercial Managed Care - PPO | Source: Ambulatory Visit | Attending: Internal Medicine | Admitting: Internal Medicine

## 2021-12-05 ENCOUNTER — Other Ambulatory Visit: Payer: Self-pay | Admitting: Internal Medicine

## 2021-12-05 DIAGNOSIS — R10814 Left lower quadrant abdominal tenderness: Secondary | ICD-10-CM

## 2021-12-05 MED ORDER — IOPAMIDOL (ISOVUE-300) INJECTION 61%
100.0000 mL | Freq: Once | INTRAVENOUS | Status: AC | PRN
  Administered 2021-12-05: 100 mL via INTRAVENOUS

## 2022-08-14 DIAGNOSIS — N907 Vulvar cyst: Secondary | ICD-10-CM | POA: Insufficient documentation

## 2023-03-13 ENCOUNTER — Other Ambulatory Visit (HOSPITAL_COMMUNITY): Payer: Self-pay | Admitting: Adult Health

## 2023-03-13 ENCOUNTER — Ambulatory Visit (INDEPENDENT_AMBULATORY_CARE_PROVIDER_SITE_OTHER): Payer: Commercial Managed Care - PPO

## 2023-03-13 DIAGNOSIS — Z87442 Personal history of urinary calculi: Secondary | ICD-10-CM

## 2023-03-13 DIAGNOSIS — R1031 Right lower quadrant pain: Secondary | ICD-10-CM | POA: Diagnosis not present

## 2023-03-13 DIAGNOSIS — R310 Gross hematuria: Secondary | ICD-10-CM

## 2023-03-15 ENCOUNTER — Other Ambulatory Visit: Payer: Self-pay | Admitting: Urology

## 2023-03-19 ENCOUNTER — Other Ambulatory Visit: Payer: Self-pay | Admitting: Urology

## 2023-03-26 ENCOUNTER — Encounter (HOSPITAL_BASED_OUTPATIENT_CLINIC_OR_DEPARTMENT_OTHER): Payer: Self-pay | Admitting: Urology

## 2023-03-26 NOTE — Progress Notes (Signed)
Spoke with pt about upcoming ESWL procedure 03/29/23. Arrival time 0645, NPO after midnight, may take pain or nausea medication with sip of water. Reviewed allergies, medications, and history. Will hold supplements, ASA, and NSAID products per protocol. Per pt, postmenopausal per blood work, no menstrual period since ablation. Reviewed pre-op instructions. Pt to bring blue PSC folder day of procedure. Pt verbalized understanding and states no further questions. Contact number given. Spouse to be driver.

## 2023-03-29 ENCOUNTER — Ambulatory Visit (HOSPITAL_COMMUNITY): Payer: Commercial Managed Care - PPO

## 2023-03-29 ENCOUNTER — Encounter (HOSPITAL_BASED_OUTPATIENT_CLINIC_OR_DEPARTMENT_OTHER): Payer: Self-pay | Admitting: Urology

## 2023-03-29 ENCOUNTER — Other Ambulatory Visit: Payer: Self-pay

## 2023-03-29 ENCOUNTER — Ambulatory Visit (HOSPITAL_BASED_OUTPATIENT_CLINIC_OR_DEPARTMENT_OTHER)
Admission: RE | Admit: 2023-03-29 | Discharge: 2023-03-29 | Disposition: A | Discharge status: Home / Self Care | Payer: Commercial Managed Care - PPO | Source: Ambulatory Visit | Attending: Urology | Admitting: Urology

## 2023-03-29 ENCOUNTER — Encounter (HOSPITAL_BASED_OUTPATIENT_CLINIC_OR_DEPARTMENT_OTHER)
Admission: RE | Disposition: A | Discharge status: Home / Self Care | Payer: Self-pay | Source: Ambulatory Visit | Attending: Urology

## 2023-03-29 DIAGNOSIS — N201 Calculus of ureter: Secondary | ICD-10-CM | POA: Insufficient documentation

## 2023-03-29 HISTORY — DX: Diverticulitis of intestine, part unspecified, without perforation or abscess without bleeding: K57.92

## 2023-03-29 HISTORY — PX: EXTRACORPOREAL SHOCK WAVE LITHOTRIPSY: SHX1557

## 2023-03-29 SURGERY — LITHOTRIPSY, ESWL
Anesthesia: LOCAL | Laterality: Right

## 2023-03-29 MED ORDER — CIPROFLOXACIN HCL 500 MG PO TABS
500.0000 mg | ORAL_TABLET | ORAL | Status: AC
  Administered 2023-03-29: 500 mg via ORAL

## 2023-03-29 MED ORDER — DIAZEPAM 5 MG PO TABS
10.0000 mg | ORAL_TABLET | ORAL | Status: AC
  Administered 2023-03-29: 10 mg via ORAL

## 2023-03-29 MED ORDER — DIPHENHYDRAMINE HCL 25 MG PO CAPS
25.0000 mg | ORAL_CAPSULE | ORAL | Status: AC
  Administered 2023-03-29: 25 mg via ORAL

## 2023-03-29 MED ORDER — DIPHENHYDRAMINE HCL 25 MG PO CAPS
ORAL_CAPSULE | ORAL | Status: AC
  Filled 2023-03-29: qty 1

## 2023-03-29 MED ORDER — DIAZEPAM 5 MG PO TABS
ORAL_TABLET | ORAL | Status: AC
  Filled 2023-03-29: qty 2

## 2023-03-29 MED ORDER — CIPROFLOXACIN HCL 500 MG PO TABS
ORAL_TABLET | ORAL | Status: AC
  Filled 2023-03-29: qty 1

## 2023-03-29 MED ORDER — SODIUM CHLORIDE 0.9 % IV SOLN: INTRAVENOUS | Status: DC

## 2023-03-29 NOTE — H&P (Signed)
Karen Watts is an 57 y.o. female.    Chief Complaint: Pre-Op RIGHT Shockwave Lithotripsy  HPI:   1 - RIGHT Distal Ureteral Stone - 7mm Rt distal stone by CT 02/2022 and persistant on office KUB. Stone is solitary and medial to femoral head. Most recent UA without infectious parameters.  Today "Karen Watts" is seen to proceed with shockwave lithotripsy. No interval fevers. KUB today with ? Calcificaiton in area of concern but much less obvious than prior.   Past Medical History:  Diagnosis Date   Allergy    Anemia    history of   Blood transfusion without reported diagnosis    Diverticulitis     Past Surgical History:  Procedure Laterality Date   APPENDECTOMY     child   ENDOMETRIAL ABLATION  11/27/2003   HERNIA REPAIR     child   TUBAL LIGATION Bilateral 2004    History reviewed. No pertinent family history. Social History:  reports that she has never smoked. She has never used smokeless tobacco. She reports current alcohol use. She reports that she does not use drugs.  Allergies:  Allergies  Allergen Reactions   Codeine Nausea And Vomiting    Medications Prior to Admission  Medication Sig Dispense Refill   diphenhydrAMINE (BENADRYL) 25 MG tablet Take 25 mg by mouth every 6 (six) hours as needed for itching.     fluticasone (FLONASE) 50 MCG/ACT nasal spray Place 1 spray into both nostrils daily.     loratadine (CLARITIN) 10 MG tablet Take 10 mg by mouth daily.     Multiple Vitamins-Minerals (MULTIVITAMIN WITH MINERALS) tablet Take 1 tablet by mouth daily.     promethazine (PHENERGAN) 25 MG tablet Take 25 mg by mouth every 6 (six) hours as needed for nausea or vomiting.     tamsulosin (FLOMAX) 0.4 MG CAPS capsule Take 0.4 mg by mouth daily.     traMADol (ULTRAM) 50 MG tablet Take 50 mg by mouth every 6 (six) hours as needed.     HYDROcodone-acetaminophen (NORCO/VICODIN) 5-325 MG per tablet Take 1 tablet by mouth every 6 (six) hours as needed. (Patient not taking: Reported  on 01/09/2017) 30 tablet 0    No results found for this or any previous visit (from the past 48 hour(s)). No results found.  Review of Systems  Constitutional:  Negative for chills and fever.  All other systems reviewed and are negative.   There were no vitals taken for this visit. Physical Exam Vitals reviewed.  HENT:     Head: Normocephalic.  Eyes:     Pupils: Pupils are equal, round, and reactive to light.  Cardiovascular:     Rate and Rhythm: Normal rate.  Pulmonary:     Effort: Pulmonary effort is normal.  Abdominal:     General: Abdomen is flat.  Genitourinary:    Comments: No CVAT at present Musculoskeletal:        General: Normal range of motion.     Cervical back: Normal range of motion.  Skin:    General: Skin is warm.  Neurological:     Mental Status: She is alert.  Psychiatric:        Mood and Affect: Mood normal.      Assessment/Plan  Proceed as planned with Rt SWL pending localization. If not able to loczlize, will rec ureteroscopy.   Loletta Parish., MD 03/29/2023, 7:23 AM

## 2023-03-29 NOTE — Discharge Instructions (Addendum)
1 - You may have urinary urgency (bladder spasms), pass small stone fragments, and bloody urine on / off for up to 2 weeks. This is normal. ° °2 - Call MD or go to ER for fever >102, severe pain / nausea / vomiting not relieved by medications, or acute change in medical status ° °

## 2023-04-01 ENCOUNTER — Encounter (HOSPITAL_BASED_OUTPATIENT_CLINIC_OR_DEPARTMENT_OTHER): Payer: Self-pay | Admitting: Urology

## 2023-04-01 NOTE — Brief Op Note (Signed)
03/29/2023  1:34 PM  PATIENT:  Karen Watts  57 y.o. female  PRE-OPERATIVE DIAGNOSIS:  RIGHT URETERAL STONE  POST-OPERATIVE DIAGNOSIS:  * No post-op diagnosis entered *  PROCEDURE:  Procedure(s): RT EXTRACORPOREAL SHOCK WAVE LITHOTRIPSY (ESWL) (Right)  SURGEON:  Surgeon(s) and Role:    * Matei Magnone, Delbert Phenix., MD - Primary  PHYSICIAN ASSISTANT:   ASSISTANTS: none   ANESTHESIA:   MAC  EBL:  minimal   BLOOD ADMINISTERED:none  DRAINS: none   LOCAL MEDICATIONS USED:  NONE  SPECIMEN:  No Specimen  DISPOSITION OF SPECIMEN:  N/A  COUNTS:  YES  TOURNIQUET:  * No tourniquets in log *  DICTATION: .Note written in paper chart  PLAN OF CARE: Discharge to home after PACU  PATIENT DISPOSITION:  Short Stay   Delay start of Pharmacological VTE agent (>24hrs) due to surgical blood loss or risk of bleeding: not applicable

## 2023-04-02 ENCOUNTER — Inpatient Hospital Stay (HOSPITAL_COMMUNITY)
Admission: EM | Admit: 2023-04-02 | Discharge: 2023-04-03 | DRG: 694 | Disposition: A | Discharge status: Home / Self Care | Payer: Commercial Managed Care - PPO | Attending: Internal Medicine | Admitting: Internal Medicine

## 2023-04-02 ENCOUNTER — Other Ambulatory Visit: Payer: Self-pay

## 2023-04-02 ENCOUNTER — Emergency Department (HOSPITAL_COMMUNITY): Payer: Commercial Managed Care - PPO

## 2023-04-02 ENCOUNTER — Encounter (HOSPITAL_COMMUNITY): Payer: Self-pay

## 2023-04-02 DIAGNOSIS — E876 Hypokalemia: Secondary | ICD-10-CM | POA: Diagnosis present

## 2023-04-02 DIAGNOSIS — R109 Unspecified abdominal pain: Secondary | ICD-10-CM | POA: Diagnosis present

## 2023-04-02 DIAGNOSIS — J309 Allergic rhinitis, unspecified: Secondary | ICD-10-CM | POA: Diagnosis present

## 2023-04-02 DIAGNOSIS — N179 Acute kidney failure, unspecified: Principal | ICD-10-CM

## 2023-04-02 DIAGNOSIS — N200 Calculus of kidney: Secondary | ICD-10-CM | POA: Diagnosis present

## 2023-04-02 DIAGNOSIS — N132 Hydronephrosis with renal and ureteral calculous obstruction: Principal | ICD-10-CM | POA: Diagnosis present

## 2023-04-02 DIAGNOSIS — N201 Calculus of ureter: Secondary | ICD-10-CM | POA: Diagnosis not present

## 2023-04-02 DIAGNOSIS — J301 Allergic rhinitis due to pollen: Secondary | ICD-10-CM

## 2023-04-02 DIAGNOSIS — T40415A Adverse effect of fentanyl or fentanyl analogs, initial encounter: Secondary | ICD-10-CM | POA: Diagnosis not present

## 2023-04-02 DIAGNOSIS — R0902 Hypoxemia: Secondary | ICD-10-CM | POA: Diagnosis not present

## 2023-04-02 DIAGNOSIS — R112 Nausea with vomiting, unspecified: Secondary | ICD-10-CM | POA: Diagnosis present

## 2023-04-02 DIAGNOSIS — Z79899 Other long term (current) drug therapy: Secondary | ICD-10-CM | POA: Diagnosis not present

## 2023-04-02 DIAGNOSIS — Z885 Allergy status to narcotic agent status: Secondary | ICD-10-CM | POA: Diagnosis not present

## 2023-04-02 DIAGNOSIS — Y92239 Unspecified place in hospital as the place of occurrence of the external cause: Secondary | ICD-10-CM | POA: Diagnosis not present

## 2023-04-02 LAB — COMPREHENSIVE METABOLIC PANEL
ALT: 18 U/L (ref 0–44)
AST: 27 U/L (ref 15–41)
Albumin: 4.4 g/dL (ref 3.5–5.0)
Alkaline Phosphatase: 76 U/L (ref 38–126)
Anion gap: 15 (ref 5–15)
BUN: 14 mg/dL (ref 6–20)
CO2: 22 mmol/L (ref 22–32)
Calcium: 9.4 mg/dL (ref 8.9–10.3)
Chloride: 103 mmol/L (ref 98–111)
Creatinine, Ser: 1.38 mg/dL — ABNORMAL HIGH (ref 0.44–1.00)
GFR, Estimated: 45 mL/min — ABNORMAL LOW (ref >60)
Glucose, Bld: 126 mg/dL — ABNORMAL HIGH (ref 70–99)
Potassium: 3.1 mmol/L — ABNORMAL LOW (ref 3.5–5.1)
Sodium: 140 mmol/L (ref 135–145)
Total Bilirubin: 0.9 mg/dL (ref 0.3–1.2)
Total Protein: 6.8 g/dL (ref 6.5–8.1)

## 2023-04-02 LAB — LIPASE, BLOOD: Lipase: 38 U/L (ref 11–51)

## 2023-04-02 LAB — URINALYSIS, ROUTINE W REFLEX MICROSCOPIC
Bilirubin Urine: NEGATIVE
Glucose, UA: NEGATIVE mg/dL
Hgb urine dipstick: NEGATIVE
Ketones, ur: 20 mg/dL — AB
Leukocytes,Ua: NEGATIVE
Nitrite: NEGATIVE
Protein, ur: NEGATIVE mg/dL
Specific Gravity, Urine: 1.01 (ref 1.005–1.030)
pH: 8 (ref 5.0–8.0)

## 2023-04-02 LAB — CBC
HCT: 43.2 % (ref 36.0–46.0)
Hemoglobin: 14.2 g/dL (ref 12.0–15.0)
MCH: 28.9 pg (ref 26.0–34.0)
MCHC: 32.9 g/dL (ref 30.0–36.0)
MCV: 88 fL (ref 80.0–100.0)
Platelets: 220 10*3/uL (ref 150–400)
RBC: 4.91 MIL/uL (ref 3.87–5.11)
RDW: 12.1 % (ref 11.5–15.5)
WBC: 10 10*3/uL (ref 4.0–10.5)
nRBC: 0 % (ref 0.0–0.2)

## 2023-04-02 LAB — MAGNESIUM: Magnesium: 1.9 mg/dL (ref 1.7–2.4)

## 2023-04-02 MED ORDER — NALOXONE HCL 0.4 MG/ML IJ SOLN: 0.4000 mg | INTRAMUSCULAR | Status: DC | PRN

## 2023-04-02 MED ORDER — HYDROCODONE-ACETAMINOPHEN 5-325 MG PO TABS: 1.0000 | ORAL_TABLET | Freq: Four times a day (QID) | ORAL | Status: DC | PRN

## 2023-04-02 MED ORDER — POTASSIUM CHLORIDE 10 MEQ/100ML IV SOLN
10.0000 meq | INTRAVENOUS | Status: AC
  Administered 2023-04-02 – 2023-04-03 (×4): 10 meq via INTRAVENOUS
  Filled 2023-04-02 (×4): qty 100

## 2023-04-02 MED ORDER — KETOROLAC TROMETHAMINE 15 MG/ML IJ SOLN
15.0000 mg | Freq: Once | INTRAMUSCULAR | Status: AC
  Administered 2023-04-02: 15 mg via INTRAVENOUS
  Filled 2023-04-02: qty 1

## 2023-04-02 MED ORDER — SODIUM CHLORIDE 0.9 % IV BOLUS
500.0000 mL | Freq: Once | INTRAVENOUS | Status: AC
  Administered 2023-04-02: 500 mL via INTRAVENOUS

## 2023-04-02 MED ORDER — NALOXONE HCL 0.4 MG/ML IJ SOLN
INTRAMUSCULAR | Status: AC
  Administered 2023-04-02: 0.4 mg via INTRAVENOUS
  Filled 2023-04-02: qty 1

## 2023-04-02 MED ORDER — ONDANSETRON HCL 4 MG/2ML IJ SOLN
4.0000 mg | Freq: Once | INTRAMUSCULAR | Status: AC
  Administered 2023-04-02: 4 mg via INTRAVENOUS
  Filled 2023-04-02: qty 2

## 2023-04-02 MED ORDER — NALOXONE HCL 0.4 MG/ML IJ SOLN: 0.4000 mg | Freq: Once | INTRAMUSCULAR | Status: AC

## 2023-04-02 MED ORDER — ACETAMINOPHEN 325 MG PO TABS: 650.0000 mg | ORAL_TABLET | Freq: Four times a day (QID) | ORAL | Status: DC | PRN

## 2023-04-02 MED ORDER — ONDANSETRON HCL 4 MG/2ML IJ SOLN
4.0000 mg | Freq: Four times a day (QID) | INTRAMUSCULAR | Status: DC | PRN
  Administered 2023-04-03: 4 mg via INTRAVENOUS
  Filled 2023-04-02: qty 2

## 2023-04-02 MED ORDER — KETOROLAC TROMETHAMINE 15 MG/ML IJ SOLN
15.0000 mg | Freq: Four times a day (QID) | INTRAMUSCULAR | Status: DC | PRN
  Administered 2023-04-03: 15 mg via INTRAVENOUS
  Filled 2023-04-02: qty 1

## 2023-04-02 MED ORDER — ACETAMINOPHEN 650 MG RE SUPP: 650.0000 mg | Freq: Four times a day (QID) | RECTAL | Status: DC | PRN

## 2023-04-02 MED ORDER — IOHEXOL 300 MG/ML  SOLN
70.0000 mL | Freq: Once | INTRAMUSCULAR | Status: AC | PRN
  Administered 2023-04-02: 70 mL via INTRAVENOUS

## 2023-04-02 MED ORDER — FENTANYL CITRATE PF 50 MCG/ML IJ SOSY
100.0000 ug | PREFILLED_SYRINGE | Freq: Once | INTRAMUSCULAR | Status: AC
  Administered 2023-04-02: 100 ug via INTRAVENOUS
  Filled 2023-04-02: qty 2

## 2023-04-02 MED ORDER — MELATONIN 3 MG PO TABS: 3.0000 mg | ORAL_TABLET | Freq: Every evening | ORAL | Status: DC | PRN

## 2023-04-02 MED ORDER — TRAMADOL HCL 50 MG PO TABS
50.0000 mg | ORAL_TABLET | Freq: Once | ORAL | Status: AC
  Administered 2023-04-02: 50 mg via ORAL
  Filled 2023-04-02: qty 1

## 2023-04-02 MED ORDER — OXYCODONE-ACETAMINOPHEN 5-325 MG PO TABS
1.0000 | ORAL_TABLET | Freq: Four times a day (QID) | ORAL | Status: DC | PRN
  Administered 2023-04-02 – 2023-04-03 (×2): 1 via ORAL
  Filled 2023-04-02 (×2): qty 1

## 2023-04-02 MED ORDER — LACTATED RINGERS IV SOLN: INTRAVENOUS | Status: DC

## 2023-04-02 MED ORDER — NALOXONE HCL 0.4 MG/ML IJ SOLN
INTRAMUSCULAR | Status: AC
  Filled 2023-04-02: qty 1

## 2023-04-02 MED ORDER — SODIUM CHLORIDE 0.9 % IV BOLUS
1000.0000 mL | Freq: Once | INTRAVENOUS | Status: AC
  Administered 2023-04-02: 1000 mL via INTRAVENOUS

## 2023-04-02 NOTE — Consult Note (Signed)
Urology Consult   Physician requesting consult: Alona Bene, MD  Reason for consult: nephrolithiasis, pain  History of Present Illness: Karen Watts is a 57 y.o. female with a PMH of diverticulitis and nephrolithiasis s/p R ESWL on 03/29/23 for 6mm distal right ureteral stone who presented with intractable pain and n/v. Patient reports persistent RLQ and R flank pain since her procedure last Friday. She was taking tramadol PRN which helped at first but has become progressively less effective. She endorses associated nausea, vomiting, poor PO intake. She is voiding spontaneously and denies hematuria, though she noes some "difficulty" urinating. She denies fevers at home, but endorses a temperature of 100.1 in the ED. She and husband report this was a temp taken under her arm. Patient eventually came to the ED after struggling to control her pain and tolerate PO over the last day.   In the ED patient is clearly uncomfortable with reports of persistent nausea. She has had emesis x1. She became unresponsive after receiving fentanyl and developed hypotension and bradycardia. She received narcan and was bag ventilated with good response. Since that time, she has been normotensive with HR in the 70s-90s during my in-person evaluation.  Labs with WBC 10.0, Cr 1.38. UA without e/o infection.  Past Medical History:  Diagnosis Date   Allergy    Anemia    history of   Blood transfusion without reported diagnosis    Diverticulitis     Past Surgical History:  Procedure Laterality Date   APPENDECTOMY     child   ENDOMETRIAL ABLATION  11/27/2003   EXTRACORPOREAL SHOCK WAVE LITHOTRIPSY Right 03/29/2023   Procedure: RT EXTRACORPOREAL SHOCK WAVE LITHOTRIPSY (ESWL);  Surgeon: Loletta Parish., MD;  Location: [facility];  Service: Urology;  Laterality: Right;   HERNIA REPAIR     child   TUBAL LIGATION Bilateral 2004     Current Hospital Medications:  Home meds:  No current  facility-administered medications on file prior to encounter.   Current Outpatient Medications on File Prior to Encounter  Medication Sig Dispense Refill   fluticasone (FLONASE) 50 MCG/ACT nasal spray Place 1 spray into both nostrils daily.     ibuprofen (ADVIL) 200 MG tablet Take 400 mg by mouth daily at 6 (six) AM.     Multiple Vitamins-Minerals (MULTIVITAMIN WITH MINERALS) tablet Take 1 tablet by mouth daily.     promethazine (PHENERGAN) 25 MG tablet Take 25 mg by mouth every 6 (six) hours as needed for nausea or vomiting.     promethazine (PHENERGAN) 50 MG suppository Place 50 mg rectally every 6 (six) hours as needed for nausea or vomiting.     tamsulosin (FLOMAX) 0.4 MG CAPS capsule Take 0.4 mg by mouth daily.     traMADol (ULTRAM) 50 MG tablet Take 50 mg by mouth every 6 (six) hours as needed.     diphenhydrAMINE (BENADRYL) 25 MG tablet Take 25 mg by mouth every 6 (six) hours as needed for itching.     loratadine (CLARITIN) 10 MG tablet Take 10 mg by mouth daily.       Scheduled Meds:  naloxone       Continuous Infusions:  lactated ringers     potassium chloride     PRN Meds:.acetaminophen **OR** acetaminophen, ketorolac, melatonin, naloxone, naLOXone (NARCAN)  injection, ondansetron (ZOFRAN) IV, oxyCODONE-acetaminophen  Allergies:  Allergies  Allergen Reactions   Codeine Nausea And Vomiting    History reviewed. No pertinent family history.  Social History:  reports  that she has never smoked. She has never used smokeless tobacco. She reports current alcohol use. She reports that she does not use drugs.  ROS: A complete review of systems was performed.  All systems are negative except for pertinent findings as noted.  Physical Exam:  Vital signs in last 24 hours: Temp:  [97.8 F (36.6 C)-100.1 F (37.8 C)] 98.3 F (36.8 C) (05/07 2119) Pulse Rate:  [55-98] 56 (05/07 1935) Resp:  [9-31] 10 (05/07 1935) BP: (81-124)/(56-81) 123/66 (05/07 1935) SpO2:  [34 %-100 %] 100  % (05/07 1935) Weight:  [68 kg] 68 kg (05/07 1556) Constitutional:  Alert and oriented, No acute distress Cardiovascular: Regular rate and rhythm, No JVD Respiratory: Normal respiratory effort, Lungs clear bilaterally GI: Abdomen is soft, TTP in RLQ GU: voiding spontaneously Lymphatic: No lymphadenopathy Neurologic: Grossly intact, no focal deficits Psychiatric: Normal mood and affect  Laboratory Data:  Recent Labs    04/02/23 1625  WBC 10.0  HGB 14.2  HCT 43.2  PLT 220    Recent Labs    04/02/23 1625  NA 140  K 3.1*  CL 103  GLUCOSE 126*  BUN 14  CALCIUM 9.4  CREATININE 1.38*     Results for orders placed or performed during the hospital encounter of 04/02/23 (from the past 24 hour(s))  Urinalysis, Routine w reflex microscopic -Urine, Clean Catch     Status: Abnormal   Collection Time: 04/02/23  3:57 PM  Result Value Ref Range   Color, Urine STRAW (A) YELLOW   APPearance CLEAR CLEAR   Specific Gravity, Urine 1.010 1.005 - 1.030   pH 8.0 5.0 - 8.0   Glucose, UA NEGATIVE NEGATIVE mg/dL   Hgb urine dipstick NEGATIVE NEGATIVE   Bilirubin Urine NEGATIVE NEGATIVE   Ketones, ur 20 (A) NEGATIVE mg/dL   Protein, ur NEGATIVE NEGATIVE mg/dL   Nitrite NEGATIVE NEGATIVE   Leukocytes,Ua NEGATIVE NEGATIVE  Lipase, blood     Status: None   Collection Time: 04/02/23  4:25 PM  Result Value Ref Range   Lipase 38 11 - 51 U/L  Comprehensive metabolic panel     Status: Abnormal   Collection Time: 04/02/23  4:25 PM  Result Value Ref Range   Sodium 140 135 - 145 mmol/L   Potassium 3.1 (L) 3.5 - 5.1 mmol/L   Chloride 103 98 - 111 mmol/L   CO2 22 22 - 32 mmol/L   Glucose, Bld 126 (H) 70 - 99 mg/dL   BUN 14 6 - 20 mg/dL   Creatinine, Ser 1.61 (H) 0.44 - 1.00 mg/dL   Calcium 9.4 8.9 - 09.6 mg/dL   Total Protein 6.8 6.5 - 8.1 g/dL   Albumin 4.4 3.5 - 5.0 g/dL   AST 27 15 - 41 U/L   ALT 18 0 - 44 U/L   Alkaline Phosphatase 76 38 - 126 U/L   Total Bilirubin 0.9 0.3 - 1.2 mg/dL    GFR, Estimated 45 (L) >60 mL/min   Anion gap 15 5 - 15  CBC     Status: None   Collection Time: 04/02/23  4:25 PM  Result Value Ref Range   WBC 10.0 4.0 - 10.5 K/uL   RBC 4.91 3.87 - 5.11 MIL/uL   Hemoglobin 14.2 12.0 - 15.0 g/dL   HCT 04.5 40.9 - 81.1 %   MCV 88.0 80.0 - 100.0 fL   MCH 28.9 26.0 - 34.0 pg   MCHC 32.9 30.0 - 36.0 g/dL   RDW 91.4 78.2 -  15.5 %   Platelets 220 150 - 400 K/uL   nRBC 0.0 0.0 - 0.2 %  Magnesium     Status: None   Collection Time: 04/02/23  8:52 PM  Result Value Ref Range   Magnesium 1.9 1.7 - 2.4 mg/dL   No results found for this or any previous visit (from the past 240 hour(s)).  Renal Function: Recent Labs    04/02/23 1625  CREATININE 1.38*   Estimated Creatinine Clearance: 41 mL/min (A) (by C-G formula based on SCr of 1.38 mg/dL (H)).  Radiologic Imaging: CT ABDOMEN PELVIS W CONTRAST  Result Date: 04/02/2023 CLINICAL DATA:  Acute right lower quadrant abdominal pain. EXAM: CT ABDOMEN AND PELVIS WITH CONTRAST TECHNIQUE: Multidetector CT imaging of the abdomen and pelvis was performed using the standard protocol following bolus administration of intravenous contrast. RADIATION DOSE REDUCTION: This exam was performed according to the departmental dose-optimization program which includes automated exposure control, adjustment of the mA and/or kV according to patient size and/or use of iterative reconstruction technique. CONTRAST:  70mL OMNIPAQUE IOHEXOL 300 MG/ML  SOLN COMPARISON:  March 13, 2023. FINDINGS: Lower chest: No acute abnormality. Hepatobiliary: No cholelithiasis or biliary dilatation is noted. Stable hepatic cysts. Pancreas: Unremarkable. No pancreatic ductal dilatation or surrounding inflammatory changes. Spleen: Normal in size without focal abnormality. Adrenals/Urinary Tract: Adrenal glands appear normal. Left kidney and ureter are unremarkable. Mild right hydroureteronephrosis is noted secondary to 4 mm stone fragment at right  ureterovesical junction. At least 2 smaller stone fragments are seen slightly more proximally within the ureter. Urinary bladder is otherwise unremarkable. Stomach/Bowel: Stomach is unremarkable. Status post appendectomy. There is no evidence of bowel obstruction. Sigmoid diverticulosis is noted without definite acute inflammation. There is persistent narrowing and wall thickening of the sigmoid colon in this area most likely representing sequela of prior diverticulitis, but neoplasm cannot be excluded. Vascular/Lymphatic: No significant vascular findings are present. No enlarged abdominal or pelvic lymph nodes. Reproductive: Uterus and bilateral adnexa are unremarkable. Other: No abdominal wall hernia or abnormality. No abdominopelvic ascites. Musculoskeletal: No acute or significant osseous findings. IMPRESSION: Mild right hydroureteronephrosis is noted secondary to 4 mm stone fragment at right ureterovesical junction. It least 2 smaller stone fragments are noted slightly more proximally within the ureter. Sigmoid diverticulosis is noted without acute inflammation. However, there is persistent narrowing of wall thickening of the sigmoid colon in this area most likely representing chronic sequela of prior diverticulitis, but neoplasm cannot be excluded. Correlation with colonoscopy or findings of previous colonoscopy is recommended. Electronically Signed   By: Lupita Raider M.D.   On: 04/02/2023 18:50    I independently reviewed the above imaging studies.  Impression/Recommendation: 3F with right nephrolithiasis s/p R ESL with residual obstructing right distal ureteral stone and intractable pain, nausea/vomiting.   She is nontoxic and her labs, including UA, do not suggest infection. A temperature of 100.16F was reportedly obtained from the underarm, while recorded oral temps have been normothermic. Apart from an episode of unresponsiveness 2/2 fentanyl, from which patient has recovered appropriately,  patient is hemodynamically stable.  - Admission to Hospitalist for pain control, IV hydration, anti-emetics - Recommend initiating flomax 0.4mg  nightly  - Strain all voided urine, obtain PVR x1  - Trend Cr on AM labs; suspect AKI is combination pre- and post-renal given poor PO/emesis - Multimodal analgesia, ice packs/heat pads, flomax (as above) - Please make patient NPO at MN for consideration of possible R ureteral stent versus stone extraction if pain persists  through AM despite appropriate pain regimen - Please alert Urology if patient becomes hemodynamically unstable or febrile for consideration of more emergent stent  Discussed with Dr. Narda Bonds [place] Territo 04/02/2023, 9:28 PM

## 2023-04-02 NOTE — ED Provider Notes (Signed)
Emergency Department Provider Note   I have reviewed the triage vital signs and the nursing notes.   HISTORY  Chief Complaint Abdominal Pain   HPI Karen Watts is a 57 y.o. female past history reviewed below presents emergency department with severe right flank pain and nausea/vomiting.  The symptoms are in the setting of lithotripsy stone in the right flank on 5/3 with Dr. Berneice Heinrich.  Patient tolerated the procedure well initially but pain became severe the past 24 hours.  She is having severe pain and nausea/vomiting.  No fevers or chills.  No chest discomfort or shortness of breath.  She states the pain feels similar to her kidney stone pain which necessitated the procedure.  No stent was placed per husband and operative note review.    Past Medical History:  Diagnosis Date   Allergy    Anemia    history of   Blood transfusion without reported diagnosis    Diverticulitis     Review of Systems  Constitutional: No fever/chills Eyes: No visual changes. ENT: No sore throat. Cardiovascular: Denies chest pain. Respiratory: Denies shortness of breath. Gastrointestinal: Positive right abdominal pain. Positive nausea and vomiting.  No diarrhea.  No constipation. Genitourinary: Negative for dysuria. Musculoskeletal: Positive for back pain. Skin: Negative for rash. Neurological: Negative for headaches.   ____________________________________________   PHYSICAL EXAM:  VITAL SIGNS: ED Triage Vitals  Enc Vitals Group     BP 04/02/23 1555 111/62     Pulse Rate 04/02/23 1555 72     Resp 04/02/23 1555 19     Temp 04/02/23 1555 97.8 F (36.6 C)     Temp Source 04/02/23 1555 Oral     SpO2 04/02/23 1555 99 %     Weight 04/02/23 1556 150 lb (68 kg)     Height 04/02/23 1556 5\' 5"  (1.651 m)   Constitutional: Alert and oriented. Appears uncomfortable with frequent shifting but able to provide a history.  Eyes: Conjunctivae are normal.  Head: Atraumatic. Nose: No  congestion/rhinnorhea. Mouth/Throat: Mucous membranes are moist.   Neck: No stridor.   Cardiovascular: Normal rate, regular rhythm. Good peripheral circulation. Grossly normal heart sounds.   Respiratory: Normal respiratory effort.  No retractions. Lungs CTAB. Gastrointestinal: Soft with mild tenderness in the right abdomen. No peritonitis. No left side tenderness. No distention.  Musculoskeletal: No lower extremity tenderness nor edema. No gross deformities of extremities. Neurologic:  Normal speech and language. No gross focal neurologic deficits are appreciated.  Skin:  Skin is warm, dry and intact. No rash noted.  ____________________________________________   LABS (all labs ordered are listed, but only abnormal results are displayed)  Labs Reviewed  COMPREHENSIVE METABOLIC PANEL - Abnormal; Notable for the following components:      Result Value   Potassium 3.1 (*)    Glucose, Bld 126 (*)    Creatinine, Ser 1.38 (*)    GFR, Estimated 45 (*)    All other components within normal limits  URINALYSIS, ROUTINE W REFLEX MICROSCOPIC - Abnormal; Notable for the following components:   Color, Urine STRAW (*)    Ketones, ur 20 (*)    All other components within normal limits  URINE CULTURE  LIPASE, BLOOD  CBC  MAGNESIUM  CBC WITH DIFFERENTIAL/PLATELET  COMPREHENSIVE METABOLIC PANEL  MAGNESIUM  PROTIME-INR   ____________________________________________  RADIOLOGY  CT ABDOMEN PELVIS W CONTRAST  Result Date: 04/02/2023 CLINICAL DATA:  Acute right lower quadrant abdominal pain. EXAM: CT ABDOMEN AND PELVIS WITH CONTRAST TECHNIQUE: Multidetector CT  imaging of the abdomen and pelvis was performed using the standard protocol following bolus administration of intravenous contrast. RADIATION DOSE REDUCTION: This exam was performed according to the departmental dose-optimization program which includes automated exposure control, adjustment of the mA and/or kV according to patient size and/or  use of iterative reconstruction technique. CONTRAST:  70mL OMNIPAQUE IOHEXOL 300 MG/ML  SOLN COMPARISON:  March 13, 2023. FINDINGS: Lower chest: No acute abnormality. Hepatobiliary: No cholelithiasis or biliary dilatation is noted. Stable hepatic cysts. Pancreas: Unremarkable. No pancreatic ductal dilatation or surrounding inflammatory changes. Spleen: Normal in size without focal abnormality. Adrenals/Urinary Tract: Adrenal glands appear normal. Left kidney and ureter are unremarkable. Mild right hydroureteronephrosis is noted secondary to 4 mm stone fragment at right ureterovesical junction. At least 2 smaller stone fragments are seen slightly more proximally within the ureter. Urinary bladder is otherwise unremarkable. Stomach/Bowel: Stomach is unremarkable. Status post appendectomy. There is no evidence of bowel obstruction. Sigmoid diverticulosis is noted without definite acute inflammation. There is persistent narrowing and wall thickening of the sigmoid colon in this area most likely representing sequela of prior diverticulitis, but neoplasm cannot be excluded. Vascular/Lymphatic: No significant vascular findings are present. No enlarged abdominal or pelvic lymph nodes. Reproductive: Uterus and bilateral adnexa are unremarkable. Other: No abdominal wall hernia or abnormality. No abdominopelvic ascites. Musculoskeletal: No acute or significant osseous findings. IMPRESSION: Mild right hydroureteronephrosis is noted secondary to 4 mm stone fragment at right ureterovesical junction. It least 2 smaller stone fragments are noted slightly more proximally within the ureter. Sigmoid diverticulosis is noted without acute inflammation. However, there is persistent narrowing of wall thickening of the sigmoid colon in this area most likely representing chronic sequela of prior diverticulitis, but neoplasm cannot be excluded. Correlation with colonoscopy or findings of previous colonoscopy is recommended. Electronically  Signed   By: Lupita Raider M.D.   On: 04/02/2023 18:50    ____________________________________________   PROCEDURES  Procedure(s) performed:   Procedures  CRITICAL CARE Performed by: Maia Plan Total critical care time: 35 minutes Critical care time was exclusive of separately billable procedures and treating other patients. Critical care was necessary to treat or prevent imminent or life-threatening deterioration. Critical care was time spent personally by me on the following activities: development of treatment plan with patient and/or surrogate as well as nursing, discussions with consultants, evaluation of patient's response to treatment, examination of patient, obtaining history from patient or surrogate, ordering and performing treatments and interventions, ordering and review of laboratory studies, ordering and review of radiographic studies, pulse oximetry and re-evaluation of patient's condition.  Alona Bene, MD Emergency Medicine care  ____________________________________________   INITIAL IMPRESSION / ASSESSMENT AND PLAN / ED COURSE  Pertinent labs & imaging results that were available during my care of the patient were reviewed by me and considered in my medical decision making (see chart for details).   This patient is Presenting for Evaluation of abdominal pain, which does require a range of treatment options, and is a complaint that involves a high risk of morbidity and mortality.  The Differential Diagnoses Differential diagnosis includes ovarian cyst, ovarian torsion, acute appendicitis, urinary tract infection, endometriosis, bowel obstruction, hernia, colitis, renal colic, gastroenteritis, volvulus etc.   Critical Interventions-    Medications  naloxone (NARCAN) 0.4 MG/ML injection (0 mg  Hold 04/02/23 1639)  acetaminophen (TYLENOL) tablet 650 mg (has no administration in time range)    Or  acetaminophen (TYLENOL) suppository 650 mg (has no administration  in time range)  melatonin tablet 3 mg (has no administration in time range)  ondansetron (ZOFRAN) injection 4 mg (has no administration in time range)  naloxone [facility]) injection 0.4 mg (has no administration in time range)  ketorolac (TORADOL) 15 MG/ML injection 15 mg (has no administration in time range)  potassium chloride 10 mEq in 100 mL IVPB (10 mEq Intravenous New Bag/Given 04/02/23 2243)  lactated ringers infusion ( Intravenous New Bag/Given 04/02/23 2137)  oxyCODONE-acetaminophen (PERCOCET/ROXICET) 5-325 MG per tablet 1 tablet (1 tablet Oral Given 04/02/23 2135)  sodium chloride 0.9 % bolus 500 mL (0 mLs Intravenous Stopped 04/02/23 1751)  ondansetron (ZOFRAN) injection 4 mg (4 mg Intravenous Given 04/02/23 1619)  fentaNYL (SUBLIMAZE) injection 100 mcg (100 mcg Intravenous Given 04/02/23 1619)  naloxone (NARCAN) injection 0.4 mg (0.4 mg Intravenous Given 04/02/23 1639)  sodium chloride 0.9 % bolus 1,000 mL (0 mLs Intravenous Stopped 04/02/23 1751)  ketorolac (TORADOL) 15 MG/ML injection 15 mg (15 mg Intravenous Given 04/02/23 1751)  iohexol (OMNIPAQUE) 300 MG/ML solution 70 mL (70 mLs Intravenous Contrast Given 04/02/23 1833)  traMADol (ULTRAM) tablet 50 mg (50 mg Oral Given 04/02/23 1954)  ondansetron (ZOFRAN) injection 4 mg (4 mg Intravenous Given 04/02/23 1934)  ketorolac (TORADOL) 15 MG/ML injection 15 mg (15 mg Intravenous Given 04/02/23 2026)    Reassessment after intervention: Pain improved but then returned shortly afterwards.    I did obtain Additional Historical Information from husband at bedside.   I decided to review pertinent External Data, and in summary with lithotripsy on 03/29/23 with Dr. Berneice Heinrich. No stent placement.    Clinical Laboratory Tests Ordered, included CBC without leukocytosis.  No clear urinary tract infection.  Mild renal insufficiency with creatinine of 1.38 but no recent labs for comparison.  Radiologic Tests Ordered, included CT abdomen pelvis. I independently interpreted  the images and agree with radiology interpretation.   Cardiac Monitor Tracing which shows NSR.    Social Determinants of Health Risk patient is a non-smoker.   Consult complete with Urology, Dr. Delanna Ahmadi. Attempted pain mgmt and nausea mgmt in the ED without success. Discussed [facility] admit for AKI/pain/nausea mgmt and Urology to consult in the AM for stent consideration.   Medical Decision Making: Summary:  Presents emergency department valuation of severe right flank pain along with nausea and vomiting.  Had a recent lithotripsy on the right.  Pain symptoms remind her of renal colic experienced with her prior stone.  Question fragments of that stone passing and possibly causing some ureteral colic pain.  She does have some tenderness in the right lower abdomen as well.  Does have prior history of appendectomy but given recent instrumentation and severe pain along with tenderness plan for CT abdomen pelvis with contrast, labs, UA, pain management, reassess.  04:40 PM  Called to bedside with patient suddenly becoming unresponsive.  Nursing staff called for help and found the patient hypoxemic after receiving fentanyl.  We assisted breathing with BVM and supplemental O2. Patient given 0.4 mg of Narcan with clinical improvement. She is now awake and breathing without assistance. [place] O2 placed for comfort.   08:25 PM  Patient given the Tramadol PO but pain is returning and severe. Nausea has also returned. Symptoms not controlled to the point that she is able to return home. Plan for [facility] admit for symptom mgmt and Urology consultation for stent consideration.   Reevaluation with update and discussion with patient. She is feeling better. Awake and alert. No residual hypotension or vomiting. No AMS. 4mm stone at  the UVJ noted with hydro. Will trial home pain medications and discuss with Urology.   Patient's presentation is most consistent with acute presentation with potential threat to life or bodily  function.   Disposition: admit  ____________________________________________  FINAL CLINICAL IMPRESSION(S) / ED DIAGNOSES  Final diagnoses:  AKI (acute kidney injury) (HCC)  Right ureteral stone  Nausea and vomiting, unspecified vomiting type     Note:  This document was prepared using Dragon voice recognition software and may include unintentional dictation errors.  Alona Bene, MD, [facility] Emergency Medicine    Milanna Kozlov, Arlyss Repress, MD 04/03/23 0000

## 2023-04-02 NOTE — ED Triage Notes (Addendum)
Patient had a right lithotripsy Friday. Has had severe and worsening abdominal pain that is unmanageable. Vomiting began today. Feeling like she wants to pass out.

## 2023-04-02 NOTE — H&P (Signed)
History and Physical      [name]:[id number] DOB: Aug 09, 1966 DOA: 04/02/2023; DOS: 04/02/2023  PCP: Melida Quitter, MD  Patient coming from: home   I have personally briefly reviewed patient's old medical records in [facility]  Chief Complaint: Right flank pain  HPI: Karen Watts is a 57 y.o. female with medical history significant for recent right ureteral stent, Allergic rhinitis, who is admitted to [facility] on 04/02/2023 with obstructing right ureteral stone fragment after presenting from home to [facility] ED complaining of right flank pain.   Patient was recently diagnosed with 7 mm right ureteral stone for which she underwent lithotripsy on 03/29/2023 with Dr. Berneice Heinrich of urology.  This was performed as an outpatient, and she was sent home on Flomax as well as prn endocrine and as needed tramadol.  However, starting on Sunday, 03/31/2023, she notes 3 days of progressive sharp nonradiating right flank discomfort that has become constant and progressively more intense over that timeframe.  Worse with palpation over the right flank.  This has been associated with intermittent nausea resulting in at least 3-4 episodes of nonbloody, nonbilious emesis over that timeframe, with most recent episode of such  occurring just prior to presenting to [facility] emergency department today.  She denies any associated dysuria nor any gross hematuria or change in urinary urgency/frequency.  Not associate with any subjective fever, chills, rigors, or generalized myalgias.  Not on a blood thinners as an outpatient, including no aspirin.  Denies any known history of underlying heart failure.  No recent chest pain, shortness of breath, orthopnea, PND, or worsening of peripheral edema.    ED Course:  While in the ED today, she received a dose of fentanyl 100 mcg IV x 1, following which she developed some apnea, and desaturated, prompting her to require ventilation via bag-valve-mask briefly  during which time Narcan was administered.   Following this episode of apnea, vital signs in the ED were notable for the following: Temperature max 100.1, heart rates in the 50s 80s; systolic blood pressures in the low 100s to 120s; respiratory rate 16-22, and oxygen saturation 98 to 100% on room air.  Labs were notable for the following: CMP notable for the following: Sodium 140, potassium 3.1, bicarbonate 22, creatinine 1.38 without any prior serum creatinine data points available for point comparison, liver enzymes within normal limits.  Lipase 38.  CBC notable for white cell count 10,000, hemoglobin 14.3, platelet count 220.  Urinalysis showed no white blood cells and was nitrate/leukocyte esterase negative.  Per my interpretation, EKG in ED demonstrated the following: The interpretation of which was complicated by the presence of artifact, but within these confines, appears to demonstrate sinus rhythm with heart rate 61, normal intervals, no evidence of T wave or ST changes, including no evidence of ST elevation.  Imaging and additional notable ED work-up: CT abdomen/pelvis with contrast, performed radiology read, shows mild right hydroureteronephrosis secondary to a 4 mm stone fragment at the right ureterovesicular junction, with at least 2 additional small stone fragments slightly more proximal within the ureter.  EDP discussed patient's case with the on-call urologist, Dr. Ferne Reus, Who is the urology fellow, who recommended [facility] admission, and conveyed that urology will formally consult, and is planning for right ureteral stent placement in the morning,04/03/23.  While in the ED, the following were administered: Toradol 15 mg IV x 2 doses, Zofran 4 mg IV x 2 doses, tramadol 50 mg p.o. x 1  dose, normal saline x 1 L bolus, lactated Ringer's x 500 cc bolus.  Subsequently, the patient was admitted for further evaluation management of obstructing right ureteral stone fragment, with presenting labs also  notable for hypokalemia.     Review of Systems: As per HPI otherwise 10 point review of systems negative.   Past Medical History:  Diagnosis Date   Allergy    Anemia    history of   Blood transfusion without reported diagnosis    Diverticulitis     Past Surgical History:  Procedure Laterality Date   APPENDECTOMY     child   ENDOMETRIAL ABLATION  11/27/2003   EXTRACORPOREAL SHOCK WAVE LITHOTRIPSY Right 03/29/2023   Procedure: RT EXTRACORPOREAL SHOCK WAVE LITHOTRIPSY (ESWL);  Surgeon: Loletta Parish., MD;  Location: [facility];  Service: Urology;  Laterality: Right;   HERNIA REPAIR     child   TUBAL LIGATION Bilateral 2004    Social History:  reports that she has never smoked. She has never used smokeless tobacco. She reports current alcohol use. She reports that she does not use drugs.   Allergies  Allergen Reactions   Codeine Nausea And Vomiting    History reviewed. No pertinent family history.  Family history reviewed and not pertinent    Prior to Admission medications   Medication Sig Start Date End Date Taking? Authorizing Provider  diphenhydrAMINE (BENADRYL) 25 MG tablet Take 25 mg by mouth every 6 (six) hours as needed for itching.    [provider]  fluticasone (FLONASE) 50 MCG/ACT nasal spray Place 1 spray into both nostrils daily.    [provider]  HYDROcodone-acetaminophen (NORCO/VICODIN) 5-325 MG per tablet Take 1 tablet by mouth every 6 (six) hours as needed. Patient not taking: Reported on 01/09/2017 10/19/14   Jonita Albee, MD  loratadine (CLARITIN) 10 MG tablet Take 10 mg by mouth daily.    [provider]  Multiple Vitamins-Minerals (MULTIVITAMIN WITH MINERALS) tablet Take 1 tablet by mouth daily.    [provider]  promethazine (PHENERGAN) 25 MG tablet Take 25 mg by mouth every 6 (six) hours as needed for nausea or vomiting.    [provider]  tamsulosin (FLOMAX) 0.4 MG CAPS  capsule Take 0.4 mg by mouth daily.    [provider]  traMADol (ULTRAM) 50 MG tablet Take 50 mg by mouth every 6 (six) hours as needed.    [provider]     Objective    Physical Exam: Vitals:   04/02/23 1640 04/02/23 1800 04/02/23 1935 04/02/23 1957  BP: 96/61 110/60 123/66   Pulse: 73 98 (!) 56   Resp: (!) 28 20 10    Temp:    100.1 F (37.8 C)  TempSrc:      SpO2: 100% 96% 100%   Weight:      Height:        General: appears to be stated age; alert, oriented Skin: warm, dry, no rash Head:  AT/[place] Mouth:  Oral mucosa membranes appear dry, normal dentition Neck: supple; trachea midline Heart:  RRR; did not appreciate any M/R/G Lungs: CTAB, did not appreciate any wheezes, rales, or rhonchi Abdomen: + BS; soft, ND, mild right flank tenderness Vascular: 2+ pedal pulses b/l; 2+ radial pulses b/l Extremities: no peripheral edema, no muscle wasting Neuro: strength and sensation intact in upper and lower extremities b/l     Labs on Admission: I have personally reviewed following labs and imaging studies  CBC: Recent  Labs  Lab 04/02/23 1625  WBC 10.0  HGB 14.2  HCT 43.2  MCV 88.0  PLT 220   Basic Metabolic Panel: Recent Labs  Lab 04/02/23 1625  NA 140  K 3.1*  CL 103  CO2 22  GLUCOSE 126*  BUN 14  CREATININE 1.38*  CALCIUM 9.4   GFR: Estimated Creatinine Clearance: 41 mL/min (A) (by C-G formula based on SCr of 1.38 mg/dL (H)). Liver Function Tests: Recent Labs  Lab 04/02/23 1625  AST 27  ALT 18  ALKPHOS 76  BILITOT 0.9  PROT 6.8  ALBUMIN 4.4   Recent Labs  Lab 04/02/23 1625  LIPASE 38   No results for input(s): "AMMONIA" in the last 168 hours. Coagulation Profile: No results for input(s): "INR", "PROTIME" in the last 168 hours. Cardiac Enzymes: No results for input(s): "CKTOTAL", "CKMB", "CKMBINDEX", "TROPONINI" in the last 168 hours. BNP (last 3 results) No results for input(s): "PROBNP" in the last 8760  hours. HbA1C: No results for input(s): "HGBA1C" in the last 72 hours. CBG: No results for input(s): "GLUCAP" in the last 168 hours. Lipid Profile: No results for input(s): "CHOL", "HDL", "LDLCALC", "TRIG", "CHOLHDL", "LDLDIRECT" in the last 72 hours. Thyroid Function Tests: No results for input(s): "TSH", "T4TOTAL", "FREET4", "T3FREE", "THYROIDAB" in the last 72 hours. Anemia Panel: No results for input(s): "VITAMINB12", "FOLATE", "FERRITIN", "TIBC", "IRON", "RETICCTPCT" in the last 72 hours. Urine analysis:    Component Value Date/Time   COLORURINE STRAW (A) 04/02/2023 1557   APPEARANCEUR CLEAR 04/02/2023 1557   LABSPEC 1.010 04/02/2023 1557   PHURINE 8.0 04/02/2023 1557   GLUCOSEU NEGATIVE 04/02/2023 1557   HGBUR NEGATIVE 04/02/2023 1557   BILIRUBINUR NEGATIVE 04/02/2023 1557   BILIRUBINUR neg 04/15/2014 1930   KETONESUR 20 (A) 04/02/2023 1557   PROTEINUR NEGATIVE 04/02/2023 1557   UROBILINOGEN 0.2 04/15/2014 1930   NITRITE NEGATIVE 04/02/2023 1557   LEUKOCYTESUR NEGATIVE 04/02/2023 1557    Radiological Exams on Admission: CT ABDOMEN PELVIS W CONTRAST  Result Date: 04/02/2023 CLINICAL DATA:  Acute right lower quadrant abdominal pain. EXAM: CT ABDOMEN AND PELVIS WITH CONTRAST TECHNIQUE: Multidetector CT imaging of the abdomen and pelvis was performed using the standard protocol following bolus administration of intravenous contrast. RADIATION DOSE REDUCTION: This exam was performed according to the departmental dose-optimization program which includes automated exposure control, adjustment of the mA and/or kV according to patient size and/or use of iterative reconstruction technique. CONTRAST:  70mL OMNIPAQUE IOHEXOL 300 MG/ML  SOLN COMPARISON:  March 13, 2023. FINDINGS: Lower chest: No acute abnormality. Hepatobiliary: No cholelithiasis or biliary dilatation is noted. Stable hepatic cysts. Pancreas: Unremarkable. No pancreatic ductal dilatation or surrounding inflammatory changes.  Spleen: Normal in size without focal abnormality. Adrenals/Urinary Tract: Adrenal glands appear normal. Left kidney and ureter are unremarkable. Mild right hydroureteronephrosis is noted secondary to 4 mm stone fragment at right ureterovesical junction. At least 2 smaller stone fragments are seen slightly more proximally within the ureter. Urinary bladder is otherwise unremarkable. Stomach/Bowel: Stomach is unremarkable. Status post appendectomy. There is no evidence of bowel obstruction. Sigmoid diverticulosis is noted without definite acute inflammation. There is persistent narrowing and wall thickening of the sigmoid colon in this area most likely representing sequela of prior diverticulitis, but neoplasm cannot be excluded. Vascular/Lymphatic: No significant vascular findings are present. No enlarged abdominal or pelvic lymph nodes. Reproductive: Uterus and bilateral adnexa are unremarkable. Other: No abdominal wall hernia or abnormality. No abdominopelvic ascites. Musculoskeletal: No acute or significant osseous findings. IMPRESSION: Mild right hydroureteronephrosis  is noted secondary to 4 mm stone fragment at right ureterovesical junction. It least 2 smaller stone fragments are noted slightly more proximally within the ureter. Sigmoid diverticulosis is noted without acute inflammation. However, there is persistent narrowing of wall thickening of the sigmoid colon in this area most likely representing chronic sequela of prior diverticulitis, but neoplasm cannot be excluded. Correlation with colonoscopy or findings of previous colonoscopy is recommended. Electronically Signed   By: Lupita Raider M.D.   On: 04/02/2023 18:50      Assessment/Plan    Principal Problem:   Right kidney stone Active Problems:   Hypokalemia   Acute right flank pain   Nausea & vomiting   Allergic rhinitis     #) Obstructing right ureteral stone fragment: After recently undergoing lithotripsy for right ureteral stone  on 03/29/2023, the patient presents with 3 days of progressive right flank discomfort, with ensuing CT abdomen/pelvis showing evidence of right hydroureter ureteral nephrosis secondary to 4 mm stone fragment at the UVJ.  She denies any acute urinary symptoms, and urinalysis does not appear to be consistent with UTI.  EDP discussed patient's case with the on-call urologist, Dr. Ferne Reus, Who is the urology fellow, recommending [facility] admission, and conveyed that urology will formally consult and are planning for ureteral stent placement on the morning of 04/03/2023.  Chales Abrahams Score for this patient in the context of anticipated aforementioned surgery conveys a  0.1% perioperative risk for significant cardiac event. No evidence to suggest acutely decompensated heart failure or acute MI. Consequently, no absolute contraindications to proceeding with proposed surgery at this time.  Of note, given that she appears to be opioid nave, will be conservative with opioid analgesia, as further indicated below.  Plan: Urology consulted, as above.  N.p.o. after midnight in anticipation of right ureteral stent placement the morning.  Continuous lactated Ringer's at 100 cc/h.  Check INR.  Prn IV Toradol.  Prn Percocet..  Zofran.  CMP, CBC in the morning.  Holding home Flomax for now in the setting of ensuing n.p.o. status.             #) Hypokalemia: Presenting serum potassium level 3.1, in the context of recent increase in GI losses in the form of intermittent nausea/vomiting over the last few days as a consequence of obstructing right ureteral stone fragment, as above.  Plan: Potassium chloride 40 mill equivalents IV over 4 hours.  Add on serum magnesium level.  Monitor on telemetry.  Repeat CMP in the morning.  As needed IV Zofran.               #) Allergic Rhinitis: documented h/o such, on scheduled intranasal Flonase as outpatient as well as scheduled Claritin.    Plan: cont home Flonase.  Hold  home Claritin for now in the setting of impending n.p.o. status.      DVT prophylaxis: SCD's   Code Status: Full code Family Communication: none Disposition Plan: Per Rounding Team Consults called: EDP discussed patient's case with the on-call urology fellow, Dr. Ferne Reus, As further detailed above;  Admission status: inpt    I SPENT GREATER THAN 75  MINUTES IN CLINICAL CARE TIME/MEDICAL DECISION-MAKING IN COMPLETING THIS ADMISSION.     Chaney Born Sohum Delillo DO [facility] From 7PM - 7AM   04/02/2023, 8:52 PM

## 2023-04-02 NOTE — ED Notes (Signed)
Called to patient room for emergency. Dr. Jacqulyn Bath, RT, charge RN, 2 NT, and 1 RN bedside. Pt unresponsive with oxygen level low and low BP. Pt bagged by RT. Pt given narcan via verbal order Dr. Jacqulyn Bath. Pt become responsive and breathing on her own. Pt BP recovered at 111/69.

## 2023-04-03 ENCOUNTER — Encounter (HOSPITAL_COMMUNITY): Payer: Self-pay | Admitting: Internal Medicine

## 2023-04-03 DIAGNOSIS — E876 Hypokalemia: Secondary | ICD-10-CM | POA: Diagnosis present

## 2023-04-03 DIAGNOSIS — N200 Calculus of kidney: Secondary | ICD-10-CM

## 2023-04-03 DIAGNOSIS — R109 Unspecified abdominal pain: Secondary | ICD-10-CM | POA: Diagnosis present

## 2023-04-03 DIAGNOSIS — R112 Nausea with vomiting, unspecified: Secondary | ICD-10-CM | POA: Diagnosis present

## 2023-04-03 DIAGNOSIS — J309 Allergic rhinitis, unspecified: Secondary | ICD-10-CM | POA: Diagnosis present

## 2023-04-03 DIAGNOSIS — N201 Calculus of ureter: Secondary | ICD-10-CM

## 2023-04-03 LAB — COMPREHENSIVE METABOLIC PANEL
ALT: 14 U/L (ref 0–44)
AST: 17 U/L (ref 15–41)
Albumin: 3.2 g/dL — ABNORMAL LOW (ref 3.5–5.0)
Alkaline Phosphatase: 56 U/L (ref 38–126)
Anion gap: 8 (ref 5–15)
BUN: 10 mg/dL (ref 6–20)
CO2: 23 mmol/L (ref 22–32)
Calcium: 8.1 mg/dL — ABNORMAL LOW (ref 8.9–10.3)
Chloride: 106 mmol/L (ref 98–111)
Creatinine, Ser: 1.16 mg/dL — ABNORMAL HIGH (ref 0.44–1.00)
GFR, Estimated: 55 mL/min — ABNORMAL LOW (ref >60)
Glucose, Bld: 99 mg/dL (ref 70–99)
Potassium: 3.8 mmol/L (ref 3.5–5.1)
Sodium: 137 mmol/L (ref 135–145)
Total Bilirubin: 0.8 mg/dL (ref 0.3–1.2)
Total Protein: 5.3 g/dL — ABNORMAL LOW (ref 6.5–8.1)

## 2023-04-03 LAB — CBC WITH DIFFERENTIAL/PLATELET
Abs Immature Granulocytes: 0.04 10*3/uL (ref 0.00–0.07)
Basophils Absolute: 0 10*3/uL (ref 0.0–0.1)
Basophils Relative: 1 %
Eosinophils Absolute: 0 10*3/uL (ref 0.0–0.5)
Eosinophils Relative: 0 %
HCT: 35.7 % — ABNORMAL LOW (ref 36.0–46.0)
Hemoglobin: 11.8 g/dL — ABNORMAL LOW (ref 12.0–15.0)
Immature Granulocytes: 1 %
Lymphocytes Relative: 19 %
Lymphs Abs: 1.4 10*3/uL (ref 0.7–4.0)
MCH: 29.9 pg (ref 26.0–34.0)
MCHC: 33.1 g/dL (ref 30.0–36.0)
MCV: 90.6 fL (ref 80.0–100.0)
Monocytes Absolute: 0.7 10*3/uL (ref 0.1–1.0)
Monocytes Relative: 9 %
Neutro Abs: 5.4 10*3/uL (ref 1.7–7.7)
Neutrophils Relative %: 70 %
Platelets: 178 10*3/uL (ref 150–400)
RBC: 3.94 MIL/uL (ref 3.87–5.11)
RDW: 12.5 % (ref 11.5–15.5)
WBC: 7.5 10*3/uL (ref 4.0–10.5)
nRBC: 0 % (ref 0.0–0.2)

## 2023-04-03 LAB — MAGNESIUM: Magnesium: 1.9 mg/dL (ref 1.7–2.4)

## 2023-04-03 LAB — URINE CULTURE: Culture: NO GROWTH

## 2023-04-03 LAB — PROTIME-INR
INR: 1.1 (ref 0.8–1.2)
Prothrombin Time: 14.4 seconds (ref 11.4–15.2)

## 2023-04-03 MED ORDER — FLUTICASONE PROPIONATE 50 MCG/ACT NA SUSP: 1.0000 | Freq: Every day | NASAL | Status: DC

## 2023-04-03 MED ORDER — ACETAMINOPHEN 325 MG PO TABS: 650.0000 mg | ORAL_TABLET | ORAL | 2 refills | Status: AC | PRN

## 2023-04-03 MED ORDER — OXYCODONE-ACETAMINOPHEN 5-325 MG PO TABS: 1.0000 | ORAL_TABLET | Freq: Four times a day (QID) | ORAL | 0 refills | Status: DC | PRN

## 2023-04-03 MED ORDER — TAMSULOSIN HCL 0.4 MG PO CAPS: 0.4000 mg | ORAL_CAPSULE | Freq: Every day | ORAL | Status: DC

## 2023-04-03 MED ORDER — KETOROLAC TROMETHAMINE 10 MG PO TABS: 10.0000 mg | ORAL_TABLET | Freq: Four times a day (QID) | ORAL | 0 refills | Status: DC | PRN

## 2023-04-03 NOTE — Discharge Summary (Signed)
Physician Discharge Summary  Karen Watts:[id number] DOB: 1966-04-05 DOA: 04/02/2023  PCP: Melida Quitter, MD  Admit date: 04/02/2023 Discharge date: 04/03/2023  Admitted From: Home Disposition: Home  Recommendations for Outpatient Follow-up:  Follow up with PCP in 1-2 weeks Follow-up with urology as scheduled on 04/12/2023 Continue home Flomax, started on Toradol, Percocet as needed  Home Health: No Equipment/Devices: None  Discharge Condition: Stable CODE STATUS: Full code  Diet recommendation: Regular Diet  History of present illness:  Karen Watts is a 57 year old female with past medical history significant for allergic rhinitis, recent right hydronephrosis secondary to nephrolithiasis s/p lithotripsy and stenting who presented to [facility] on 04/02/2023 with complaint of flank pain.  Recently diagnosed with 7 mm right ureteral stone underwent lithotripsy on 03/29/2023 with Dr. Berneice Heinrich.  Discharged home on Flomax.  However on Sunday, 03/31/2023, she noted 3 days of progressive sharp nonradiating right flank discomfort that has become constant and progressive since.  Associated with nausea, decreased oral intake.  Denies dysuria, no gross hematuria or change in urinary urgency/frequency.  Further denies fever/chills/rigors or generalized myalgias.  In the ED, temperature 97.8 F, HR 72, RR 19, BP 111/62, SpO2 99% on room air.  WBC 10.0, hemoglobin 14.2, platelets 220.  Sodium 140, potassium 3.1, chloride 1 3, CO2 22, glucose 126, BUN 14, creatinine 1.38.  AST 27, ALT 18, total bilirubin 0.9.  Lipase 38.  Urinalysis with negative leukocytes, negative nitrite.  CT abdomen/pelvis without contrast with mild right hydro ureter nephrosis secondary to 4 mm stone fragment at the right uretervesicular junction with at least 2 smaller stone fragments noted slightly more proximally within the ureter.  Patient received Toradol 15 mg IV x 2 doses, Zofran 4 mg IV x 2 doses, tramadol 50 mg  p.o. x 1 dose, normal saline x 1 L bolus, lactated Ringer's x 500 cc bolus. Urology was consulted who recommended hospitalist admission.   Hospital course:  Right hydroureternephrosis 2/2 obstructive nephrolithiasis Patient presenting to ED with recurrent right flank pain.  Urinalysis unrevealing.  CT abdomen/pelvis notable for mild right hydroureteronephrosis secondary to 4 mm stone fragment the right UVJ junction.  Recent lithotripsy by urology, Dr. Berneice Heinrich on 03/29/2023.  Patient was started on aggressive IV fluid hydration, IV Toradol, Percocet, Zofran and Tylenol.  Patient's symptoms markedly improved with decreased nausea and pain.  Was seen by urology in follow-up with no further recommendations other than outpatient follow-up as scheduled on 04/12/2023.  Will continue Flomax 0.4 mg p.o. daily, Toradol for moderate pain, Percocet for severe pain.  Allergic rhinitis Continue Claritin, Flonase.  Discharge Diagnoses:  Principal Problem:   Right kidney stone Active Problems:   Hypokalemia   Acute right flank pain   Nausea & vomiting   Allergic rhinitis   Right ureteral stone    Discharge Instructions  Discharge Instructions     Call MD for:  difficulty breathing, headache or visual disturbances   Complete by: As directed    Call MD for:  extreme fatigue   Complete by: As directed    Call MD for:  persistant dizziness or light-headedness   Complete by: As directed    Call MD for:  persistant nausea and vomiting   Complete by: As directed    Call MD for:  severe uncontrolled pain   Complete by: As directed    Call MD for:  temperature >100.4   Complete by: As directed    Diet - low sodium heart healthy  Complete by: As directed    Increase activity slowly   Complete by: As directed       Allergies as of 04/03/2023       Reactions   Codeine Nausea And Vomiting        Medication List     STOP taking these medications    traMADol 50 MG tablet Commonly known as:  ULTRAM       TAKE these medications    acetaminophen 325 MG tablet Commonly known as: Tylenol Take 2 tablets (650 mg total) by mouth every 4 (four) hours as needed for mild pain.   diphenhydrAMINE 25 MG tablet Commonly known as: BENADRYL Take 25 mg by mouth every 6 (six) hours as needed for itching.   fluticasone 50 MCG/ACT nasal spray Commonly known as: FLONASE Place 1 spray into both nostrils daily.   ibuprofen 200 MG tablet Commonly known as: ADVIL Take 400 mg by mouth daily at 6 (six) AM.   ketorolac 10 MG tablet Commonly known as: TORADOL Take 1 tablet (10 mg total) by mouth every 6 (six) hours as needed for moderate pain.   loratadine 10 MG tablet Commonly known as: CLARITIN Take 10 mg by mouth daily.   multivitamin with minerals tablet Take 1 tablet by mouth daily.   oxyCODONE-acetaminophen 5-325 MG tablet Commonly known as: PERCOCET/ROXICET Take 1 tablet by mouth every 6 (six) hours as needed for severe pain.   promethazine 25 MG tablet Commonly known as: PHENERGAN Take 25 mg by mouth every 6 (six) hours as needed for nausea or vomiting.   promethazine 50 MG suppository Commonly known as: PHENERGAN Place 50 mg rectally every 6 (six) hours as needed for nausea or vomiting.   tamsulosin 0.4 MG Caps capsule Commonly known as: FLOMAX Take 0.4 mg by mouth daily.        Follow-up Information     Melida Quitter, MD. Schedule an appointment as soon as possible for a visit in 1 week(s).   Specialty: Internal Medicine Contact information: [address] [place] [address] [phone]         [facility]. Go on 04/12/2023.   Contact information: [address] [place] [address] [place] [address] [phone]               Allergies  Allergen Reactions   Codeine Nausea And Vomiting    Consultations: Urology   Procedures/Studies: DG Abd 1 View  Result Date: 04/02/2023 CLINICAL DATA:  Ureteral stone. EXAM: ABDOMEN -  1 VIEW COMPARISON:  None Available. FINDINGS: The bowel gas pattern is normal. Calcifications in the pelvis has been stable measuring about 6 mm consistent with a distal ureteral stone as depicted on prior CT scan. IMPRESSION: Calcification in the pelvis consistent with known distal ureteral stone on the right. No change. Electronically Signed   By: Layla Maw M.D.   On: 04/02/2023 19:54   CT ABDOMEN PELVIS W CONTRAST  Result Date: 04/02/2023 CLINICAL DATA:  Acute right lower quadrant abdominal pain. EXAM: CT ABDOMEN AND PELVIS WITH CONTRAST TECHNIQUE: Multidetector CT imaging of the abdomen and pelvis was performed using the standard protocol following bolus administration of intravenous contrast. RADIATION DOSE REDUCTION: This exam was performed according to the departmental dose-optimization program which includes automated exposure control, adjustment of the mA and/or kV according to patient size and/or use of iterative reconstruction technique. CONTRAST:  70mL OMNIPAQUE IOHEXOL 300 MG/ML  SOLN COMPARISON:  March 13, 2023. FINDINGS: Lower chest: No acute  abnormality. Hepatobiliary: No cholelithiasis or biliary dilatation is noted. Stable hepatic cysts. Pancreas: Unremarkable. No pancreatic ductal dilatation or surrounding inflammatory changes. Spleen: Normal in size without focal abnormality. Adrenals/Urinary Tract: Adrenal glands appear normal. Left kidney and ureter are unremarkable. Mild right hydroureteronephrosis is noted secondary to 4 mm stone fragment at right ureterovesical junction. At least 2 smaller stone fragments are seen slightly more proximally within the ureter. Urinary bladder is otherwise unremarkable. Stomach/Bowel: Stomach is unremarkable. Status post appendectomy. There is no evidence of bowel obstruction. Sigmoid diverticulosis is noted without definite acute inflammation. There is persistent narrowing and wall thickening of the sigmoid colon in this area most likely representing  sequela of prior diverticulitis, but neoplasm cannot be excluded. Vascular/Lymphatic: No significant vascular findings are present. No enlarged abdominal or pelvic lymph nodes. Reproductive: Uterus and bilateral adnexa are unremarkable. Other: No abdominal wall hernia or abnormality. No abdominopelvic ascites. Musculoskeletal: No acute or significant osseous findings. IMPRESSION: Mild right hydroureteronephrosis is noted secondary to 4 mm stone fragment at right ureterovesical junction. It least 2 smaller stone fragments are noted slightly more proximally within the ureter. Sigmoid diverticulosis is noted without acute inflammation. However, there is persistent narrowing of wall thickening of the sigmoid colon in this area most likely representing chronic sequela of prior diverticulitis, but neoplasm cannot be excluded. Correlation with colonoscopy or findings of previous colonoscopy is recommended. Electronically Signed   By: Lupita Raider M.D.   On: 04/02/2023 18:50   CT RENAL STONE STUDY  Result Date: 03/13/2023 CLINICAL DATA:  Right lower quadrant abdominal pain. Gross hematuria. EXAM: CT ABDOMEN AND PELVIS WITHOUT CONTRAST TECHNIQUE: Multidetector CT imaging of the abdomen and pelvis was performed following the standard protocol without IV contrast. RADIATION DOSE REDUCTION: This exam was performed according to the departmental dose-optimization program which includes automated exposure control, adjustment of the mA and/or kV according to patient size and/or use of iterative reconstruction technique. COMPARISON:  12/05/2021 FINDINGS: Lower chest: Unremarkable Hepatobiliary: Two benign hepatic cysts are present, the larger measuring 6.6 cm in long axis on image 16 of series 10 partially exophytic from segment 3. No further imaging workup of these lesions is indicated. Gallbladder unremarkable.  No biliary dilatation identified. Pancreas: Unremarkable Spleen: Unremarkable Adrenals/Urinary Tract: Both  adrenal glands appear normal. 0.6 by 0.4 by 0.4 cm right distal ureteral calculus located about 3.7 cm proximal to the right UVJ, without substantial hydronephrosis or hydroureter. No other stones identified. Stomach/Bowel: Postoperative findings in the lower rectum. Sigmoid colon diverticulosis. Vascular/Lymphatic: Unremarkable Reproductive: Unremarkable Other: No supplemental non-categorized findings. Musculoskeletal: Unremarkable IMPRESSION: 1. 0.6 by 0.4 by 0.4 cm right distal ureteral calculus located about 3.7 cm proximal to the right UVJ, without substantial hydronephrosis or hydroureter. 2. No other stones are identified. 3. Sigmoid colon diverticulosis. Electronically Signed   By: Gaylyn Rong M.D.   On: 03/13/2023 12:30     Subjective: Seen bedside, resting comfortably.  Husband present at bedside.  Reports her symptoms of flank pain now relatively resolved with decreased nausea.  Tolerating diet.  Seen by urology in follow-up this morning with no further recommendations other than discharge home with outpatient follow-up already scheduled.  Discussed will continue tamsulosin, Toradol, Percocet on discharge.  Patient ready for discharge home.  No other specific questions or concerns at this time.  Denies headache, no visual changes, no chest pain, no palpitations, no shortness of breath, no current nausea, no vomiting/diarrhea, no current abdominal/flank pain, no focal weakness, no fatigue, no cough/congestion, no paresthesias.  No acute events overnight per nursing staff.  Discharge Exam: Vitals:   04/03/23 0639 04/03/23 0745  BP:    Pulse:  61  Resp:  15  Temp: 97.8 F (36.6 C)   SpO2:  98%   Vitals:   04/03/23 0500 04/03/23 0600 04/03/23 0639 04/03/23 0745  BP: 100/62 100/61    Pulse: (!) 53 (!) 48  61  Resp: 14 13  15   Temp:   97.8 F (36.6 C)   TempSrc:   Oral   SpO2: 90% 95%  98%  Weight:      Height:        Physical Exam: GEN: NAD, alert and oriented x 3,  wd/wn HEENT: NCAT, PERRL, EOMI, sclera clear, MMM PULM: CTAB w/o wheezes/crackles, normal respiratory effort, on room air CV: RRR w/o M/G/R GI: abd soft, NTND, NABS, no R/G/M MSK: no peripheral edema, muscle strength globally intact 5/5 bilateral upper/lower extremities NEURO: CN II-XII intact, no focal deficits, sensation to light touch intact PSYCH: normal mood/affect Integumentary: dry/intact, no rashes or wounds    The results of significant diagnostics from this hospitalization (including imaging, microbiology, ancillary and laboratory) are listed below for reference.     Microbiology: No results found for this or any previous visit (from the past 240 hour(s)).   Labs: BNP (last 3 results) No results for input(s): "BNP" in the last 8760 hours. Basic Metabolic Panel: Recent Labs  Lab 04/02/23 1625 04/02/23 2052 04/03/23 0500  NA 140  --  137  K 3.1*  --  3.8  CL 103  --  106  CO2 22  --  23  GLUCOSE 126*  --  99  BUN 14  --  10  CREATININE 1.38*  --  1.16*  CALCIUM 9.4  --  8.1*  MG  --  1.9 1.9   Liver Function Tests: Recent Labs  Lab 04/02/23 1625 04/03/23 0500  AST 27 17  ALT 18 14  ALKPHOS 76 56  BILITOT 0.9 0.8  PROT 6.8 5.3*  ALBUMIN 4.4 3.2*   Recent Labs  Lab 04/02/23 1625  LIPASE 38   No results for input(s): "AMMONIA" in the last 168 hours. CBC: Recent Labs  Lab 04/02/23 1625 04/03/23 0500  WBC 10.0 7.5  NEUTROABS  --  5.4  HGB 14.2 11.8*  HCT 43.2 35.7*  MCV 88.0 90.6  PLT 220 178   Cardiac Enzymes: No results for input(s): "CKTOTAL", "CKMB", "CKMBINDEX", "TROPONINI" in the last 168 hours. BNP: Invalid input(s): "POCBNP" CBG: No results for input(s): "GLUCAP" in the last 168 hours. D-Dimer No results for input(s): "DDIMER" in the last 72 hours. Hgb A1c No results for input(s): "HGBA1C" in the last 72 hours. Lipid Profile No results for input(s): "CHOL", "HDL", "LDLCALC", "TRIG", "CHOLHDL", "LDLDIRECT" in the last 72  hours. Thyroid function studies No results for input(s): "TSH", "T4TOTAL", "T3FREE", "THYROIDAB" in the last 72 hours.  Invalid input(s): "FREET3" Anemia work up No results for input(s): "VITAMINB12", "FOLATE", "FERRITIN", "TIBC", "IRON", "RETICCTPCT" in the last 72 hours. Urinalysis    Component Value Date/Time   COLORURINE STRAW (A) 04/02/2023 1557   APPEARANCEUR CLEAR 04/02/2023 1557   LABSPEC 1.010 04/02/2023 1557   PHURINE 8.0 04/02/2023 1557   GLUCOSEU NEGATIVE 04/02/2023 1557   HGBUR NEGATIVE 04/02/2023 1557   BILIRUBINUR NEGATIVE 04/02/2023 1557   BILIRUBINUR neg 04/15/2014 1930   KETONESUR 20 (A) 04/02/2023 1557   PROTEINUR NEGATIVE 04/02/2023 1557   UROBILINOGEN 0.2 04/15/2014 1930   NITRITE NEGATIVE  04/02/2023 1557   LEUKOCYTESUR NEGATIVE 04/02/2023 1557   Sepsis Labs Recent Labs  Lab 04/02/23 1625 04/03/23 0500  WBC 10.0 7.5   Microbiology No results found for this or any previous visit (from the past 240 hour(s)).   Time coordinating discharge: Over 30 minutes  SIGNED:   Alvira Philips [place], DO  [facility] 04/03/2023, 8:56 AM

## 2023-04-03 NOTE — Progress Notes (Signed)
Subjective: Pain resolved overnight. Has been straining urine except for once and has not seen stone. No nausea, no emesis. Feels much improved this am.  Objective: Vital signs in last 24 hours: Temp:  [97.8 F (36.6 C)-100.1 F (37.8 C)] 97.8 F (36.6 C) (05/08 0639) Pulse Rate:  [48-98] 61 (05/08 0745) Resp:  [9-31] 15 (05/08 0745) BP: (81-136)/(56-81) 100/61 (05/08 0600) SpO2:  [34 %-100 %] 98 % (05/08 0745) Weight:  [68 kg] 68 kg (05/07 1556)  Intake/Output from previous day: 05/07 0701 - 05/08 0700 In: 400 [IV Piggyback:400] Out: -  Intake/Output this shift: No intake/output data recorded.  Physical Exam:  General: Alert and oriented CV: RRR Lungs: Clear Abdomen: Soft, ND GU: voiding spontaneously Ext: NT, No erythema  Lab Results: Recent Labs    04/02/23 1625 04/03/23 0500  HGB 14.2 11.8*  HCT 43.2 35.7*   BMET Recent Labs    04/02/23 1625 04/03/23 0500  NA 140 137  K 3.1* 3.8  CL 103 106  CO2 22 23  GLUCOSE 126* 99  BUN 14 10  CREATININE 1.38* 1.16*  CALCIUM 9.4 8.1*     Studies/Results: CT ABDOMEN PELVIS W CONTRAST  Result Date: 04/02/2023 CLINICAL DATA:  Acute right lower quadrant abdominal pain. EXAM: CT ABDOMEN AND PELVIS WITH CONTRAST TECHNIQUE: Multidetector CT imaging of the abdomen and pelvis was performed using the standard protocol following bolus administration of intravenous contrast. RADIATION DOSE REDUCTION: This exam was performed according to the departmental dose-optimization program which includes automated exposure control, adjustment of the mA and/or kV according to patient size and/or use of iterative reconstruction technique. CONTRAST:  70mL OMNIPAQUE IOHEXOL 300 MG/ML  SOLN COMPARISON:  March 13, 2023. FINDINGS: Lower chest: No acute abnormality. Hepatobiliary: No cholelithiasis or biliary dilatation is noted. Stable hepatic cysts. Pancreas: Unremarkable. No pancreatic ductal dilatation or surrounding inflammatory changes.  Spleen: Normal in size without focal abnormality. Adrenals/Urinary Tract: Adrenal glands appear normal. Left kidney and ureter are unremarkable. Mild right hydroureteronephrosis is noted secondary to 4 mm stone fragment at right ureterovesical junction. At least 2 smaller stone fragments are seen slightly more proximally within the ureter. Urinary bladder is otherwise unremarkable. Stomach/Bowel: Stomach is unremarkable. Status post appendectomy. There is no evidence of bowel obstruction. Sigmoid diverticulosis is noted without definite acute inflammation. There is persistent narrowing and wall thickening of the sigmoid colon in this area most likely representing sequela of prior diverticulitis, but neoplasm cannot be excluded. Vascular/Lymphatic: No significant vascular findings are present. No enlarged abdominal or pelvic lymph nodes. Reproductive: Uterus and bilateral adnexa are unremarkable. Other: No abdominal wall hernia or abnormality. No abdominopelvic ascites. Musculoskeletal: No acute or significant osseous findings. IMPRESSION: Mild right hydroureteronephrosis is noted secondary to 4 mm stone fragment at right ureterovesical junction. It least 2 smaller stone fragments are noted slightly more proximally within the ureter. Sigmoid diverticulosis is noted without acute inflammation. However, there is persistent narrowing of wall thickening of the sigmoid colon in this area most likely representing chronic sequela of prior diverticulitis, but neoplasm cannot be excluded. Correlation with colonoscopy or findings of previous colonoscopy is recommended. Electronically Signed   By: Lupita Raider M.D.   On: 04/02/2023 18:50    Assessment/Plan: 4F with right nephrolithiasis s/p R ESL with residual obstructing right distal ureteral stone and intractable pain, nausea/vomiting. Pain has resolved since last evaluated, suspect passed her stone or is tolerating her PO pain regimen. From a  Urologic standpoint, she  is cleared for discharge. We  will plan to see her in follow-up at previously scheduled appointment on 04/12/23. Please discharge patient on flomax 0.4mg  qHS, toradol 15mg  q6h prn (2-3 days), scheduled tylenol. Please have patient continue to strain urine after each void and keep stone fragments if they pass.     LOS: 1 day   Carlus Pavlov 04/03/2023, 8:24 AM

## 2023-04-03 NOTE — ED Notes (Signed)
Pt d/c stable condition. All questions answered. NAD. D/c with instructions and prescriptions

## 2023-08-26 DIAGNOSIS — M542 Cervicalgia: Secondary | ICD-10-CM | POA: Insufficient documentation

## 2023-08-26 DIAGNOSIS — H93293 Other abnormal auditory perceptions, bilateral: Secondary | ICD-10-CM | POA: Insufficient documentation

## 2023-10-11 ENCOUNTER — Other Ambulatory Visit: Payer: Self-pay | Admitting: Internal Medicine

## 2023-10-11 DIAGNOSIS — G4452 New daily persistent headache (NDPH): Secondary | ICD-10-CM

## 2023-11-05 ENCOUNTER — Other Ambulatory Visit: Payer: Commercial Managed Care - PPO

## 2024-01-06 ENCOUNTER — Telehealth: Payer: Self-pay | Admitting: Neurology

## 2024-01-06 NOTE — Telephone Encounter (Signed)
 At 3:53 pt left vm asking appointment be cx

## 2024-01-08 ENCOUNTER — Ambulatory Visit: Payer: Commercial Managed Care - PPO | Admitting: Neurology

## 2024-03-16 ENCOUNTER — Ambulatory Visit (INDEPENDENT_AMBULATORY_CARE_PROVIDER_SITE_OTHER)

## 2024-03-16 ENCOUNTER — Encounter: Payer: Self-pay | Admitting: Podiatry

## 2024-03-16 ENCOUNTER — Ambulatory Visit: Admitting: Podiatry

## 2024-03-16 ENCOUNTER — Other Ambulatory Visit: Payer: Self-pay | Admitting: Podiatry

## 2024-03-16 DIAGNOSIS — R5383 Other fatigue: Secondary | ICD-10-CM | POA: Insufficient documentation

## 2024-03-16 DIAGNOSIS — S93492A Sprain of other ligament of left ankle, initial encounter: Secondary | ICD-10-CM

## 2024-03-16 DIAGNOSIS — N915 Oligomenorrhea, unspecified: Secondary | ICD-10-CM | POA: Insufficient documentation

## 2024-03-16 DIAGNOSIS — S9002XA Contusion of left ankle, initial encounter: Secondary | ICD-10-CM

## 2024-03-16 DIAGNOSIS — N63 Unspecified lump in unspecified breast: Secondary | ICD-10-CM | POA: Insufficient documentation

## 2024-03-16 DIAGNOSIS — K5900 Constipation, unspecified: Secondary | ICD-10-CM | POA: Insufficient documentation

## 2024-03-16 DIAGNOSIS — L600 Ingrowing nail: Secondary | ICD-10-CM | POA: Diagnosis not present

## 2024-03-16 DIAGNOSIS — S93622A Sprain of tarsometatarsal ligament of left foot, initial encounter: Secondary | ICD-10-CM

## 2024-03-16 DIAGNOSIS — R87610 Atypical squamous cells of undetermined significance on cytologic smear of cervix (ASC-US): Secondary | ICD-10-CM | POA: Insufficient documentation

## 2024-03-16 DIAGNOSIS — N909 Noninflammatory disorder of vulva and perineum, unspecified: Secondary | ICD-10-CM | POA: Insufficient documentation

## 2024-03-16 DIAGNOSIS — R300 Dysuria: Secondary | ICD-10-CM | POA: Insufficient documentation

## 2024-03-16 DIAGNOSIS — Z9851 Tubal ligation status: Secondary | ICD-10-CM | POA: Insufficient documentation

## 2024-03-16 DIAGNOSIS — N951 Menopausal and female climacteric states: Secondary | ICD-10-CM | POA: Insufficient documentation

## 2024-03-16 DIAGNOSIS — N939 Abnormal uterine and vaginal bleeding, unspecified: Secondary | ICD-10-CM | POA: Insufficient documentation

## 2024-03-16 DIAGNOSIS — R140 Abdominal distension (gaseous): Secondary | ICD-10-CM | POA: Insufficient documentation

## 2024-03-16 DIAGNOSIS — K5730 Diverticulosis of large intestine without perforation or abscess without bleeding: Secondary | ICD-10-CM | POA: Insufficient documentation

## 2024-03-16 DIAGNOSIS — R1032 Left lower quadrant pain: Secondary | ICD-10-CM | POA: Insufficient documentation

## 2024-03-16 DIAGNOSIS — N926 Irregular menstruation, unspecified: Secondary | ICD-10-CM | POA: Insufficient documentation

## 2024-03-16 DIAGNOSIS — Z8262 Family history of osteoporosis: Secondary | ICD-10-CM | POA: Insufficient documentation

## 2024-03-16 DIAGNOSIS — R6882 Decreased libido: Secondary | ICD-10-CM | POA: Insufficient documentation

## 2024-03-16 MED ORDER — NEOMYCIN-POLYMYXIN-HC 3.5-10000-1 OT SUSP: OTIC | 0 refills | Status: AC

## 2024-03-16 NOTE — Patient Instructions (Signed)
 Soak Instructions    THE DAY AFTER THE PROCEDURE  Place 1/4 cup of epsom salts (or betadine, or white vinegar) in a quart of warm tap water.  Submerge your foot or feet with outer bandage intact for the initial soak; this will allow the bandage to become moist and wet for easy lift off.  Once you remove your bandage, continue to soak in the solution for 20 minutes.  This soak should be done twice a day.  Next, remove your foot or feet from solution, blot dry the affected area and cover.  You may use a band aid large enough to cover the area or use gauze and tape.  Apply other medications to the area as directed by the doctor such as polysporin neosporin.  IF YOUR SKIN BECOMES IRRITATED WHILE USING THESE INSTRUCTIONS, IT IS OKAY TO SWITCH TO  WHITE VINEGAR AND WATER. Or you may use antibacterial soap and water to keep the toe clean  Monitor for any signs/symptoms of infection. Call the office immediately if any occur or go directly to the emergency room. Call with any questions/concerns.    Long Term Care Instructions-Post Nail Surgery  You have had your ingrown toenail and root treated with a chemical.  This chemical causes a burn that will drain and ooze like a blister.  This can drain for 6-8 weeks or longer.  It is important to keep this area clean, covered, and follow the soaking instructions dispensed at the time of your surgery.  This area will eventually dry and form a scab.  Once the scab forms you no longer need to soak or apply a dressing.  If at any time you experience an increase in pain, redness, swelling, or drainage, you should contact the office as soon as possible.       Call to schedule physical therapy: [facility] at [facility]  [phone]

## 2024-03-17 ENCOUNTER — Ambulatory Visit: Admitting: Podiatry

## 2024-03-17 NOTE — Progress Notes (Signed)
 Subjective:  Patient ID: Karen Watts, female    DOB: 03-14-1966,  MRN: [id number]  Chief Complaint  Patient presents with   Toe Pain    Hallux bilateral - medial border, possible ingrown x 6 weeks, right gets irritated often   Ankle Injury    Medial/anterior ankle left - twisted ankle 7 weeks ago, still having pain, some swelling laterally, tried ankle brace, but wasn't comfortable, chiropractor worked with it as well   New Patient (Initial Visit)    Discussed the use of AI scribe software for clinical note transcription with the patient, who gave verbal consent to proceed.  History of Present Illness Karen Watts is a 58 year old female who presents with persistent ankle pain and ingrown toenails following an ankle sprain.  Approximately seven weeks ago, she experienced an ankle sprain after falling down three steps at her home. Initially, there was bruising on the side of the ankle and the top of the foot. She continues to experience pain in certain areas of the ankle, particularly when on her feet, and notes tenderness and swelling. She has been using KT tape for support as she has not found a suitable brace. Her chiropractor has also wrapped the ankle for her. No pain in the knee or proximal fibula, but mild pain over the midfoot and tenderness in the toes are noted.  She describes significant pain in two toes when wearing shoes over the past month to month and a half. The pain is exacerbated by physical activities such as hiking and biking, especially when stepping down from rocks. She has been using high-top hiking boots for support but recently switched to trail runners, which she feels provide less support.  She reports issues with ingrown toenails, particularly on the right foot, which have been painful and tender. She has not had previous problems with ingrown toenails and is unsure if improper nail cutting contributed to the issue. She has not experienced any infections  but notes significant discomfort when cutting her nails.      Objective:    Physical Exam VASCULAR: DP and PT pulse palpable. Foot is warm and well-perfused. Capillary fill time is brisk. DERMATOLOGIC: Normal skin turgor, texture, and temperature. No open lesions, rashes, or ulcerations. Incurvated medial hallux borders bilaterally without infection, pain on palpation. NEUROLOGIC: Normal sensation to light touch and pressure. No paresthesias on examination. ORTHOPEDIC: Smooth pain-free range of motion of all examined joints. No ecchymosis or bruising. No gross deformity. Pain over ATFL, mild pain over midfoot at the Lisfranc interval. No pain over CFL, perineals, PTFL, deltoid, proximal fibular, or syndesmotic areas.   No images are attached to the encounter.    Results Procedure: Removal of ingrown toenails Description: Bilateral hallux anesthetized with 1.5 cc of 2% lidocaine and 0.5% marcaine plain. Toe exsanguinated and tourniquet applied at base. Medial border of bilateral hallux split with nail splitter and excised. Ingrown tissue excised with nail plate. Three applications of phenolic acid applied to nail bed and matrix. Irrigated with alcohol and dressed with Silvadene bandage. Cortisporin drops prescribed. Informed Consent: Risks and benefits discussed. Procedure involves numbing the toe, removing the ingrown portion of the toenail, applying medication to prevent regrowth, and post-procedure care with soaks and ointment. Ninety percent success rate mentioned.  RADIOLOGY Left ankle radiographs: No fracture, stress fracture, or acute diastasis abnormality. Ankle mortis appears normal. Talus dome normal. (03/16/2024) Left foot and ankle radiographs: No acute osseous abnormalities, no fracture, stress fracture, talar dome lesion, or Lisfranc widening. (  03/16/2024)   Assessment:   1. Sprain of anterior talofibular ligament of left ankle, initial encounter   2. Lisfranc's sprain,  left, initial encounter   3. Ingrowing left great toenail   4. Ingrowing right great toenail      Plan:  Patient was evaluated and treated and all questions answered.  Assessment and Plan Assessment & Plan Ankle sprain with possible ligament tear Chronic ankle sprain with possible partial tear of the anterior talofibular ligament (ATFL), occurring approximately seven weeks ago after a fall. Persistent pain, tenderness, and swelling in the ankle and toes, with discomfort during activities such as hiking and biking. No joint or bone structure abnormalities on x-ray. MRI not deemed necessary at this time. Physical therapy recommended to strengthen the ankle and stabilize the ligaments. Discussed use of a figure-eight ankle brace for additional support. Advised temporary rest from high-impact activities to allow healing. Surgery may be considered if no improvement with therapy. - Initiate physical therapy to strengthen the ankle and stabilize ligaments. - Provide a stabilizing ankle brace for support during activities. - Advise temporary rest from high-impact activities such as hiking and biking. - Schedule follow-up to assess progress with physical therapy. -Recommended RICE and OTC NSAIDs as needed  Ingrown toenails Bilateral ingrown toenails with significant pain and tenderness on the medial hallux borders. No current infection. Discussed treatment options including soaking and a permanent procedure to remove the ingrown portion of the toenail. She opted for the permanent procedure to prevent recurrence, especially given upcoming activities. The procedure has a 90% success rate and involves numbing the toe, removing the ingrown portion, and applying phenolic acid to prevent regrowth. - Perform permanent removal of the ingrown portion of the toenails with phenolic acid application to prevent regrowth. - Prescribe Cortisporin drops for post-procedure care. - Instruct on post-procedure care: soak  in Epsom salt and warm water, apply Cortisporin drops, and dress with a Band-Aid twice daily for at least two weeks.      Return in about 5 weeks (around 04/20/2024).

## 2024-04-13 ENCOUNTER — Ambulatory Visit: Admitting: Podiatry

## 2024-06-12 ENCOUNTER — Encounter: Payer: Self-pay | Admitting: Advanced Practice Midwife

## 2024-12-11 ENCOUNTER — Emergency Department (HOSPITAL_BASED_OUTPATIENT_CLINIC_OR_DEPARTMENT_OTHER)
Admission: EM | Admit: 2024-12-11 | Discharge: 2024-12-11 | Disposition: A | Discharge status: Home / Self Care | Attending: Emergency Medicine | Admitting: Emergency Medicine

## 2024-12-11 ENCOUNTER — Encounter (HOSPITAL_BASED_OUTPATIENT_CLINIC_OR_DEPARTMENT_OTHER): Payer: Self-pay

## 2024-12-11 ENCOUNTER — Emergency Department (HOSPITAL_BASED_OUTPATIENT_CLINIC_OR_DEPARTMENT_OTHER)

## 2024-12-11 ENCOUNTER — Other Ambulatory Visit (HOSPITAL_BASED_OUTPATIENT_CLINIC_OR_DEPARTMENT_OTHER): Payer: Self-pay | Admitting: Internal Medicine

## 2024-12-11 DIAGNOSIS — K5790 Diverticulosis of intestine, part unspecified, without perforation or abscess without bleeding: Secondary | ICD-10-CM

## 2024-12-11 DIAGNOSIS — R7401 Elevation of levels of liver transaminase levels: Secondary | ICD-10-CM

## 2024-12-11 DIAGNOSIS — K5730 Diverticulosis of large intestine without perforation or abscess without bleeding: Secondary | ICD-10-CM | POA: Insufficient documentation

## 2024-12-11 DIAGNOSIS — R197 Diarrhea, unspecified: Secondary | ICD-10-CM

## 2024-12-11 DIAGNOSIS — R109 Unspecified abdominal pain: Secondary | ICD-10-CM

## 2024-12-11 LAB — CBC
HCT: 43.5 % (ref 36.0–46.0)
Hemoglobin: 14.6 g/dL (ref 12.0–15.0)
MCH: 29.7 pg (ref 26.0–34.0)
MCHC: 33.6 g/dL (ref 30.0–36.0)
MCV: 88.4 fL (ref 80.0–100.0)
Platelets: 223 K/uL (ref 150–400)
RBC: 4.92 MIL/uL (ref 3.87–5.11)
RDW: 12.4 % (ref 11.5–15.5)
WBC: 5.3 K/uL (ref 4.0–10.5)
nRBC: 0 % (ref 0.0–0.2)

## 2024-12-11 LAB — OCCULT BLOOD X 1 CARD TO LAB, STOOL: Fecal Occult Bld: POSITIVE — AB

## 2024-12-11 LAB — COMPREHENSIVE METABOLIC PANEL WITH GFR
ALT: 52 U/L — ABNORMAL HIGH (ref 0–44)
AST: 57 U/L — ABNORMAL HIGH (ref 15–41)
Albumin: 4.4 g/dL (ref 3.5–5.0)
Alkaline Phosphatase: 96 U/L (ref 38–126)
Anion gap: 12 (ref 5–15)
BUN: 7 mg/dL (ref 6–20)
CO2: 28 mmol/L (ref 22–32)
Calcium: 10.1 mg/dL (ref 8.9–10.3)
Chloride: 102 mmol/L (ref 98–111)
Creatinine, Ser: 0.83 mg/dL (ref 0.44–1.00)
GFR, Estimated: >60 mL/min
Glucose, Bld: 90 mg/dL (ref 70–99)
Potassium: 4.2 mmol/L (ref 3.5–5.1)
Sodium: 142 mmol/L (ref 135–145)
Total Bilirubin: 0.3 mg/dL (ref 0.0–1.2)
Total Protein: 6.8 g/dL (ref 6.5–8.1)

## 2024-12-11 LAB — URINALYSIS, ROUTINE W REFLEX MICROSCOPIC
Bilirubin Urine: NEGATIVE
Glucose, UA: NEGATIVE mg/dL
Hgb urine dipstick: NEGATIVE
Ketones, ur: NEGATIVE mg/dL
Leukocytes,Ua: NEGATIVE
Nitrite: NEGATIVE
Protein, ur: NEGATIVE mg/dL
Specific Gravity, Urine: <1.005 — ABNORMAL LOW (ref 1.005–1.030)
pH: 5.5 (ref 5.0–8.0)

## 2024-12-11 LAB — LIPASE, BLOOD: Lipase: 35 U/L (ref 11–51)

## 2024-12-11 MED ORDER — LOPERAMIDE HCL 2 MG PO CAPS
4.0000 mg | ORAL_CAPSULE | Freq: Once | ORAL | Status: AC
  Administered 2024-12-11: 4 mg via ORAL
  Filled 2024-12-11: qty 2

## 2024-12-11 MED ORDER — IOHEXOL 300 MG/ML  SOLN
100.0000 mL | Freq: Once | INTRAMUSCULAR | Status: AC | PRN
  Administered 2024-12-11: 100 mL via INTRAVENOUS

## 2024-12-11 MED ORDER — LOPERAMIDE HCL 2 MG PO CAPS: 2.0000 mg | ORAL_CAPSULE | Freq: Four times a day (QID) | ORAL | 0 refills | Status: AC | PRN

## 2024-12-11 NOTE — ED Triage Notes (Signed)
 Pt c/o poor PO intake, diarrhea, nausea after diverticulitis flare up recently, abx since Wednesday, fever last night, diarrhea just not getting better. Also notes BRBPR. PCP advises ED eval

## 2024-12-11 NOTE — ED Provider Notes (Addendum)
 " Karen Watts   CSN: [id number] Arrival date & time: 12/11/24  1718     Patient presents with: Abdominal Pain and Diarrhea   Karen Watts is a 59 y.o. female who presents to the ED today primarily out of concern for abdominal discomfort has been getting worse over the last week, associated with multiple episodes of loose stools, does have a history of diverticulitis for which she is currently taking metronidazole and ciprofloxacin .  Does Watts bleeding per rectum, referred to the ED by her primary care for further assessment and imaging.    Abdominal Pain Associated symptoms: diarrhea   Diarrhea Associated symptoms: abdominal pain        Prior to Admission medications  Medication Sig Start Date End Date Taking? Authorizing Provider  loperamide  (IMODIUM ) 2 MG capsule Take 1 capsule (2 mg total) by mouth 4 (four) times daily as needed for diarrhea or loose stools. 12/11/24  Yes Myriam Dorn BROCKS, PA  cyclobenzaprine  (FLEXERIL ) 10 MG tablet Take 10 mg by mouth at bedtime as needed. 03/09/24   [provider]  fluticasone  (FLONASE ) 50 MCG/ACT nasal spray Place 1 spray into both nostrils daily.    [provider]  ibuprofen (ADVIL) 200 MG tablet Take 400 mg by mouth daily at 6 (six) AM.    [provider]  loratadine (CLARITIN) 10 MG tablet Take 10 mg by mouth daily.    [provider]  Multiple Vitamins-Minerals (MULTIVITAMIN WITH MINERALS) tablet Take 1 tablet by mouth daily.    [provider]  neomycin -polymyxin-hydrocortisone (CORTISPORIN) 3.5-10000-1 OTIC suspension Apply 1-2 drops daily after soaking and cover with bandaid 03/16/24   McDonald, Juliene SAUNDERS, DPM    Allergies: Amoxicillin-pot clavulanate and Codeine    Review of Systems  Gastrointestinal:  Positive for abdominal pain and diarrhea.  All other systems reviewed and are negative.   Updated Vital Signs BP 104/64    Pulse 75   Temp 98.2 F (36.8 C)   Resp 18   SpO2 95%   Physical Exam Vitals and nursing Watts reviewed.  Constitutional:      General: She is not in acute distress.    Appearance: Normal appearance.  HENT:     Head: Normocephalic and atraumatic.     Mouth/Throat:     Mouth: Mucous membranes are moist.     Pharynx: Oropharynx is clear.  Eyes:     Extraocular Movements: Extraocular movements intact.     Conjunctiva/sclera: Conjunctivae normal.     Pupils: Pupils are equal, round, and reactive to light.  Cardiovascular:     Rate and Rhythm: Normal rate and regular rhythm.     Pulses: Normal pulses.     Heart sounds: Normal heart sounds. No murmur heard.    No friction rub. No gallop.  Pulmonary:     Effort: Pulmonary effort is normal.     Breath sounds: Normal breath sounds.  Abdominal:     General: Abdomen is flat. Bowel sounds are normal.     Palpations: Abdomen is soft.     Tenderness: There is abdominal tenderness in the right lower quadrant and epigastric area.  Musculoskeletal:        General: Normal range of motion.     Cervical back: Normal range of motion and neck supple.     Right lower leg: No edema.     Left lower leg: No edema.  Skin:    General: Skin is warm and  dry.     Capillary Refill: Capillary refill takes less than 2 seconds.  Neurological:     General: No focal deficit present.     Mental Status: She is alert. Mental status is at baseline.  Psychiatric:        Mood and Affect: Mood normal.     (all labs ordered are listed, but only abnormal results are displayed) Labs Reviewed  COMPREHENSIVE METABOLIC PANEL WITH GFR - Abnormal; Notable for the following components:      Result Value   AST 57 (*)    ALT 52 (*)    All other components within normal limits  URINALYSIS, ROUTINE W REFLEX MICROSCOPIC - Abnormal; Notable for the following components:   Color, Urine COLORLESS (*)    Specific Gravity, Urine <1.005 (*)    All other components within  normal limits  OCCULT BLOOD X 1 CARD TO LAB, STOOL - Abnormal; Notable for the following components:   Fecal Occult Bld POSITIVE (*)    All other components within normal limits  LIPASE, BLOOD  CBC    EKG: None  Radiology: CT ABDOMEN PELVIS W CONTRAST Result Date: 12/11/2024 EXAM: CT ABDOMEN AND PELVIS WITH CONTRAST 12/11/2024 07:25:53 PM TECHNIQUE: CT of the abdomen and pelvis was performed with the administration of 100 mL iohexol  (OMNIPAQUE ) 300 MG/ML solution. Multiplanar reformatted images are provided for review. Automated exposure control, iterative reconstruction, and/or weight-based adjustment of the mA/kV was utilized to reduce the radiation dose to as low as reasonably achievable. COMPARISON: Comparison study 04/02/2023. CLINICAL HISTORY: Acute abdominal pain. FINDINGS: LOWER CHEST: No acute abnormality. LIVER: Cysts are noted within the liver, stable in appearance from the prior exam. GALLBLADDER AND BILE DUCTS: The gallbladder is within normal limits. No biliary ductal dilatation. SPLEEN: No acute abnormality. PANCREAS: No acute abnormality. ADRENAL GLANDS: No acute abnormality. KIDNEYS, URETERS AND BLADDER: No renal calculi or obstructive changes are seen. No perinephric or periureteral stranding. The bladder is partially distended. GI AND BOWEL: Stomach demonstrates no acute abnormality. Postsurgical changes are noted in the rectum. Scattered diverticular change of the sigmoid colon is noted without evidence of diverticulitis. The proximal colon is decompressed. The appendix has been surgically removed. Small bowel and sigmoid colon are within normal limits. There is no bowel obstruction. PERITONEUM AND RETROPERITONEUM: No ascites. No free air. VASCULATURE: Aorta is normal in caliber. LYMPH NODES: No lymphadenopathy. REPRODUCTIVE ORGANS: The uterus is within normal limits. BONES AND SOFT TISSUES: No acute osseous abnormality. No focal soft tissue abnormality. IMPRESSION: 1. Diverticulosis  without diverticulitis. Electronically signed by: Oneil Devonshire MD 12/11/2024 07:33 PM EST RP Workstation: [id number]     Procedures   Medications Ordered in the ED  iohexol  (OMNIPAQUE ) 300 MG/ML solution 100 mL (100 mLs Intravenous Contrast Given 12/11/24 1918)  loperamide  (IMODIUM ) capsule 4 mg (4 mg Oral Given 12/11/24 2131)    Clinical Course as of 12/11/24 2209  Fri Dec 11, 2024  1846 HCT: 43.5 [JG]    Clinical Course User Index [JG] Myriam Dorn BROCKS, PA                                 Medical Decision Making Amount and/or Complexity of Data Reviewed Labs: ordered. Decision-making details documented in ED Course. Radiology: ordered.  Risk Prescription drug management.   Medical Decision Making:   Karen Watts is a 59 y.o. female who presented to the ED today with diarrhea and upper  abdominal pain detailed above.    Additional history discussed with patient's family/caregivers.  Complete initial physical exam performed, notably the patient  was alert and oriented in no apparent distress.  There is some tenderness in the abdomen is noted, otherwise no pertinent findings on physical exam..    Reviewed and confirmed nursing documentation for past medical history, family history, social history.    Initial Assessment:   With the patient's presentation of abdominal pain and diarrhea, differential includes potential bowel obstruction, fecal impaction, gastritis, diverticulosis, colitis.  Initial Plan:  CT abdomen pelvis to evaluate for acute intra-abdominal pathology. Screening labs including CBC and Metabolic panel to evaluate for infectious or metabolic etiology of disease.  Urinalysis with reflex culture ordered to evaluate for UTI or relevant urologic/nephrologic pathology.  Obtain Hemoccult to establish presence of blood in the stool. Objective evaluation as below reviewed   Initial Study Results:   Laboratory  All laboratory results reviewed without evidence of  clinically relevant pathology.   Exceptions include: Elevated transaminases with AST being 57 and ALT 52  EKG EKG was reviewed independently. Rate, rhythm, axis, intervals all examined and without medically relevant abnormality. ST segments without concerns for elevations.    Radiology:  All images reviewed independently. Agree with radiology report at this time.   CT ABDOMEN PELVIS W CONTRAST Result Date: 12/11/2024 EXAM: CT ABDOMEN AND PELVIS WITH CONTRAST 12/11/2024 07:25:53 PM TECHNIQUE: CT of the abdomen and pelvis was performed with the administration of 100 mL iohexol  (OMNIPAQUE ) 300 MG/ML solution. Multiplanar reformatted images are provided for review. Automated exposure control, iterative reconstruction, and/or weight-based adjustment of the mA/kV was utilized to reduce the radiation dose to as low as reasonably achievable. COMPARISON: Comparison study 04/02/2023. CLINICAL HISTORY: Acute abdominal pain. FINDINGS: LOWER CHEST: No acute abnormality. LIVER: Cysts are noted within the liver, stable in appearance from the prior exam. GALLBLADDER AND BILE DUCTS: The gallbladder is within normal limits. No biliary ductal dilatation. SPLEEN: No acute abnormality. PANCREAS: No acute abnormality. ADRENAL GLANDS: No acute abnormality. KIDNEYS, URETERS AND BLADDER: No renal calculi or obstructive changes are seen. No perinephric or periureteral stranding. The bladder is partially distended. GI AND BOWEL: Stomach demonstrates no acute abnormality. Postsurgical changes are noted in the rectum. Scattered diverticular change of the sigmoid colon is noted without evidence of diverticulitis. The proximal colon is decompressed. The appendix has been surgically removed. Small bowel and sigmoid colon are within normal limits. There is no bowel obstruction. PERITONEUM AND RETROPERITONEUM: No ascites. No free air. VASCULATURE: Aorta is normal in caliber. LYMPH NODES: No lymphadenopathy. REPRODUCTIVE ORGANS: The uterus  is within normal limits. BONES AND SOFT TISSUES: No acute osseous abnormality. No focal soft tissue abnormality. IMPRESSION: 1. Diverticulosis without diverticulitis. Electronically signed by: Oneil Devonshire MD 12/11/2024 07:33 PM EST RP Workstation: [id number]    Reassessment and Plan:   CT imaging does not show any acute diverticulitis but does show continued diverticulosis.  Patient is resting comfortably in bed during the encounter, however does state that she continues to have some intermittent nausea without any vomiting.  Also does not demonstrate any acute bowel obstruction, or any other acute intra-abdominal pathology at this time.  Vital signs remained stable within normal limits.  Given the lack of leukocytosis, negative findings on CT imaging, do not find consistent with acute diverticulitis at this time, potentially diarrhea and abdominal discomfort caused by antibiotic use.  Will manage diarrhea with loperamide , encourage increased liquid intake, and dietary modifications to help with symptoms.  She voices understanding and agreement has no other concerns at this time.  As such plan at this time is to discharge with outpatient follow-up to PCP, careful return precautions have been discussed which she verbalizes understanding and agreement.  Also recommend follow-up to PCP for reevaluation of labs within the next several weeks to follow-up on transaminitis noted on lab workup today.       Final diagnoses:  Diverticulosis  Diarrhea, unspecified type    ED Discharge Orders          Ordered    loperamide  (IMODIUM ) 2 MG capsule  4 times daily PRN        12/11/24 2111               Myriam Dorn BROCKS, PA 12/11/24 2209    Myriam Dorn BROCKS, [place] 12/11/24 2209    Emil Share, DO 12/11/24 2320  "
